# Patient Record
Sex: Female | Born: 1995 | Race: White | Hispanic: No | Smoking: Never smoker
Health system: Southern US, Community
[De-identification: ages and names within clinical notes are randomized; demographics above are authoritative.]

## PROBLEM LIST (undated history)

## (undated) DIAGNOSIS — K769 Liver disease, unspecified: Secondary | ICD-10-CM

## (undated) DIAGNOSIS — I499 Cardiac arrhythmia, unspecified: Secondary | ICD-10-CM

## (undated) DIAGNOSIS — J45909 Unspecified asthma, uncomplicated: Secondary | ICD-10-CM

## (undated) DIAGNOSIS — Q615 Medullary cystic kidney: Secondary | ICD-10-CM

## (undated) DIAGNOSIS — L709 Acne, unspecified: Secondary | ICD-10-CM

## (undated) DIAGNOSIS — T8859XA Other complications of anesthesia, initial encounter: Secondary | ICD-10-CM

## (undated) DIAGNOSIS — Z8489 Family history of other specified conditions: Secondary | ICD-10-CM

## (undated) DIAGNOSIS — G8929 Other chronic pain: Secondary | ICD-10-CM

## (undated) DIAGNOSIS — R519 Headache, unspecified: Secondary | ICD-10-CM

## (undated) DIAGNOSIS — M359 Systemic involvement of connective tissue, unspecified: Secondary | ICD-10-CM

## (undated) DIAGNOSIS — N200 Calculus of kidney: Secondary | ICD-10-CM

## (undated) DIAGNOSIS — E282 Polycystic ovarian syndrome: Secondary | ICD-10-CM

## (undated) HISTORY — DX: Acne, unspecified: L70.9

## (undated) HISTORY — DX: Headache, unspecified: R51.9

## (undated) HISTORY — DX: Other chronic pain: G89.29

## (undated) HISTORY — DX: Cardiac arrhythmia, unspecified: I49.9

## (undated) HISTORY — DX: Gilbert syndrome: E80.4

## (undated) HISTORY — DX: Family history of other specified conditions: Z84.89

## (undated) HISTORY — PX: FINE NEEDLE ASPIRATION BIOPSY: CATH118315

## (undated) HISTORY — DX: Calculus of kidney: N20.0

## (undated) HISTORY — DX: Unspecified asthma, uncomplicated: J45.909

## (undated) HISTORY — DX: Liver disease, unspecified: K76.9

## (undated) HISTORY — DX: Other complications of anesthesia, initial encounter: T88.59XA

## (undated) HISTORY — DX: Systemic involvement of connective tissue, unspecified: M35.9

## (undated) HISTORY — PX: BREAST BIOPSY: SHX20

## (undated) HISTORY — DX: Medullary cystic kidney: Q61.5

## (undated) HISTORY — DX: Polycystic ovarian syndrome: E28.2

---
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
## 2016-05-12 ENCOUNTER — Other Ambulatory Visit
Admission: RE | Admit: 2016-05-12 | Discharge: 2016-05-12 | Disposition: A | Discharge status: Home / Self Care | Payer: Self-pay | Source: Ambulatory Visit | Attending: Internal Medicine | Admitting: Internal Medicine

## 2016-05-12 DIAGNOSIS — R0982 Postnasal drip: Secondary | ICD-10-CM | POA: Insufficient documentation

## 2016-05-12 DIAGNOSIS — R070 Pain in throat: Secondary | ICD-10-CM | POA: Insufficient documentation

## 2016-05-12 DIAGNOSIS — R112 Nausea with vomiting, unspecified: Secondary | ICD-10-CM | POA: Insufficient documentation

## 2016-05-12 DIAGNOSIS — R509 Fever, unspecified: Secondary | ICD-10-CM | POA: Insufficient documentation

## 2016-05-12 DIAGNOSIS — R109 Unspecified abdominal pain: Secondary | ICD-10-CM | POA: Insufficient documentation

## 2016-05-12 DIAGNOSIS — Q618 Other cystic kidney diseases: Secondary | ICD-10-CM | POA: Insufficient documentation

## 2016-05-12 LAB — CBC WITH DIFFERENTIAL/PLATELET
Basophils Absolute: 0 10*3/uL (ref 0–0.1)
Basophils Relative: 1 %
EOS ABS: 0.1 10*3/uL (ref 0–0.7)
HCT: 40.6 % (ref 35.0–47.0)
HEMOGLOBIN: 14.6 g/dL (ref 12.0–16.0)
LYMPHS ABS: 1.1 10*3/uL (ref 1.0–3.6)
MCH: 31.8 pg (ref 26.0–34.0)
MCHC: 35.9 g/dL (ref 32.0–36.0)
MCV: 88.7 fL (ref 80.0–100.0)
Monocytes Absolute: 1 10*3/uL — ABNORMAL HIGH (ref 0.2–0.9)
Neutro Abs: 6.3 10*3/uL (ref 1.4–6.5)
Neutrophils Relative %: 73 %
Platelets: 204 10*3/uL (ref 150–440)
RBC: 4.58 MIL/uL (ref 3.80–5.20)
RDW: 12.8 % (ref 11.5–14.5)
WBC: 8.6 10*3/uL (ref 3.6–11.0)

## 2016-05-12 LAB — COMPREHENSIVE METABOLIC PANEL
ALK PHOS: 46 U/L (ref 38–126)
ALT: 12 U/L — AB (ref 14–54)
ANION GAP: 11 (ref 5–15)
AST: 17 U/L (ref 15–41)
Albumin: 4.8 g/dL (ref 3.5–5.0)
BUN: 11 mg/dL (ref 6–20)
CALCIUM: 9.9 mg/dL (ref 8.9–10.3)
CO2: 25 mmol/L (ref 22–32)
CREATININE: 0.76 mg/dL (ref 0.44–1.00)
Chloride: 102 mmol/L (ref 101–111)
Glucose, Bld: 91 mg/dL (ref 65–99)
Potassium: 3.6 mmol/L (ref 3.5–5.1)
SODIUM: 138 mmol/L (ref 135–145)
TOTAL PROTEIN: 7.3 g/dL (ref 6.5–8.1)
Total Bilirubin: 2.5 mg/dL — ABNORMAL HIGH (ref 0.3–1.2)

## 2020-05-08 ENCOUNTER — Other Ambulatory Visit: Payer: Self-pay | Admitting: Obstetrics and Gynecology

## 2020-05-08 DIAGNOSIS — N6310 Unspecified lump in the right breast, unspecified quadrant: Secondary | ICD-10-CM

## 2020-08-07 ENCOUNTER — Other Ambulatory Visit: Payer: Self-pay

## 2020-08-07 ENCOUNTER — Other Ambulatory Visit: Payer: Self-pay | Admitting: Obstetrics and Gynecology

## 2020-08-07 ENCOUNTER — Ambulatory Visit
Admission: RE | Admit: 2020-08-07 | Discharge: 2020-08-07 | Disposition: A | Discharge status: Home / Self Care | Payer: 59 | Source: Ambulatory Visit | Attending: Obstetrics and Gynecology | Admitting: Obstetrics and Gynecology

## 2020-08-07 DIAGNOSIS — N6310 Unspecified lump in the right breast, unspecified quadrant: Secondary | ICD-10-CM

## 2020-08-23 ENCOUNTER — Ambulatory Visit
Admission: RE | Admit: 2020-08-23 | Discharge: 2020-08-23 | Disposition: A | Discharge status: Home / Self Care | Payer: 59 | Source: Ambulatory Visit | Attending: Obstetrics and Gynecology | Admitting: Obstetrics and Gynecology

## 2020-08-23 ENCOUNTER — Other Ambulatory Visit: Payer: Self-pay | Admitting: Obstetrics and Gynecology

## 2020-08-23 ENCOUNTER — Other Ambulatory Visit: Payer: Self-pay

## 2020-08-23 DIAGNOSIS — N6310 Unspecified lump in the right breast, unspecified quadrant: Secondary | ICD-10-CM

## 2020-08-26 NOTE — L&D Delivery Note (Signed)
Delivery Note She progressed to complete and pushed well.  At 3:45 PM a viable female was delivered via Vaginal, Spontaneous (Presentation: Left Occiput Anterior).  APGAR: 8, 9; weight pending.   Placenta status: Spontaneous, Intact.  Cord: 3 vessels with the following complications: Nuchal x 1, body x 1, delivered through.   Anesthesia: Epidural Episiotomy: None Lacerations: Perineal;2nd degree-extended to left labia Suture Repair: 3.0 vicryl rapide Est. Blood Loss (mL):  200  Mom to postpartum.  Baby to Couplet care / Skin to Skin.  Diana Yang 05/26/2021, 4:07 PM

## 2020-11-06 LAB — OB RESULTS CONSOLE ABO/RH: RH Type: POSITIVE

## 2020-11-06 LAB — OB RESULTS CONSOLE GC/CHLAMYDIA
Chlamydia: NEGATIVE
Gonorrhea: NEGATIVE

## 2020-11-06 LAB — OB RESULTS CONSOLE RPR: RPR: NONREACTIVE

## 2020-11-06 LAB — OB RESULTS CONSOLE ANTIBODY SCREEN: Antibody Screen: NEGATIVE

## 2020-11-06 LAB — OB RESULTS CONSOLE HIV ANTIBODY (ROUTINE TESTING): HIV: NONREACTIVE

## 2020-11-06 LAB — OB RESULTS CONSOLE RUBELLA ANTIBODY, IGM: Rubella: IMMUNE

## 2020-11-06 LAB — OB RESULTS CONSOLE HEPATITIS B SURFACE ANTIGEN: Hepatitis B Surface Ag: NEGATIVE

## 2020-12-04 ENCOUNTER — Encounter: Payer: Self-pay | Admitting: Registered Nurse

## 2020-12-04 ENCOUNTER — Ambulatory Visit: Payer: 59 | Admitting: Registered Nurse

## 2020-12-04 ENCOUNTER — Other Ambulatory Visit: Payer: Self-pay

## 2020-12-04 VITALS — BP 114/62 | HR 92 | Temp 98.2°F | Resp 16 | Ht 66.5 in | Wt 147.4 lb

## 2020-12-04 DIAGNOSIS — Z7689 Persons encountering health services in other specified circumstances: Secondary | ICD-10-CM | POA: Diagnosis not present

## 2020-12-04 DIAGNOSIS — M359 Systemic involvement of connective tissue, unspecified: Secondary | ICD-10-CM | POA: Diagnosis not present

## 2020-12-04 DIAGNOSIS — Z3A15 15 weeks gestation of pregnancy: Secondary | ICD-10-CM | POA: Insufficient documentation

## 2020-12-04 DIAGNOSIS — Q615 Medullary cystic kidney: Secondary | ICD-10-CM | POA: Diagnosis not present

## 2020-12-04 NOTE — Progress Notes (Signed)
New Patient Office Visit  Subjective:  Patient ID: Diana Yang, female    DOB: February 24, 1996  Age: 25 y.o. MRN: [id number]  CC:  Chief Complaint  Patient presents with  . Establish Care    No concerns looking to establish PCP, pt is currently apx 15 weeks 1st pregnancy. 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 FNA w biopsy x 2 in 2019 and 2021. Benign findings. No acute concerns. Eventually will need to reestablish with nephrology locally.  Hx of autoimmune process - unfortunately diagnostics not finished in [place] before moving to [place], but has been suggested she may have SLE. Requesting referral to rheumatology locally to continue this process.  Currently pregnant, about 15w. First pregnancy. Est with Dr. Brien Mates with [facility]. Taking prenatal vitamins and folate. No concerns. Lives with partner who is supportive, safe and healthy home.  Otherwise fine  Past Medical History:  Diagnosis Date  . Autoimmune disease ([place])    diagnostics not finished prior to pt moving to [place]  . Medullary sponge kidney     Past Surgical History:  Procedure Laterality Date  . FINE NEEDLE ASPIRATION BIOPSY     kidneys, findings wnl  . TONSILLECTOMY AND ADENOIDECTOMY  2015    Family History  Problem Relation Age of Onset  . Heart attack Mother   . Kidney disease Mother   . Asthma Mother   . Alcohol abuse Father   . Drug abuse Father   . Early death Father        63  . Stroke Father        Thyroid storm  . Graves' disease Father   . Thyroid disease Father   . Arthritis Brother        OA - from [place]  . Depression Brother   . Post-traumatic stress disorder Brother        [employer]  . Breast cancer Maternal Grandmother   . Lung cancer Maternal Grandmother        smoker - but "not from smoking"  . Multiple myeloma  Maternal Grandmother        "rare blood cancer"  . High blood pressure Maternal Grandmother   . Heart attack Maternal Grandmother   . Hearing loss Maternal Grandmother   . Liver cancer Maternal Grandfather   . Diabetes Maternal Grandfather   . High Cholesterol Maternal Grandfather   . Breast cancer Paternal Grandmother   . Heart attack Paternal Grandmother   . Heart disease Paternal Grandmother   . Alcohol abuse Paternal Grandfather     Social History   Socioeconomic History  . Marital status: Significant Other    Spouse name: Not on file  . Number of children: 0  . Years of education: Not on file  . Highest education level: Not on file  Occupational History  . Occupation: med lab  Tobacco Use  . Smoking status: Never Smoker  . Smokeless tobacco: Never Used  Substance and Sexual Activity  . Alcohol use: Not on file    Comment: prior to prgegnancy 4 drinks 1 week  . Drug use: Never  . Sexual activity: Yes  Other Topics Concern  . Not on file  Social History Narrative   From [place]. Was down here for college, recently returned after being back in [place] for a  bit.   Social Determinants of Health   Financial Resource Strain: Not on file  Food Insecurity: Not on file  Transportation Needs: Not on file  Physical Activity: Not on file  Stress: Not on file  Social Connections: Not on file  Intimate Partner Violence: Not on file    ROS Review of Systems  Constitutional: Negative.   HENT: Negative.   Eyes: Negative.   Respiratory: Negative.   Cardiovascular: Negative.   Gastrointestinal: Negative.   Genitourinary: Negative.   Musculoskeletal: Negative.   Skin: Negative.   Neurological: Negative.   Psychiatric/Behavioral: Negative.   All other systems reviewed and are negative.   Objective:   Today's Vitals: BP 114/62   Pulse 92   Temp 98.2 F (36.8 C) (Temporal)   Resp 16   Ht 5' 6.5" (1.689 m)   Wt 147 lb 6.4 oz (66.9 kg)   SpO2 99%   BMI 23.43 kg/m    Physical Exam Vitals and nursing note reviewed.  Constitutional:      General: She is not in acute distress.    Appearance: Normal appearance. She is not ill-appearing, toxic-appearing or diaphoretic.  Cardiovascular:     Rate and Rhythm: Normal rate and regular rhythm.     Pulses: Normal pulses.     Heart sounds: Normal heart sounds. No murmur heard. No friction rub. No gallop.   Pulmonary:     Effort: Pulmonary effort is normal. No respiratory distress.     Breath sounds: Normal breath sounds. No stridor. No wheezing, rhonchi or rales.  Chest:     Chest wall: No tenderness.  Skin:    General: Skin is warm and dry.     Capillary Refill: Capillary refill takes less than 2 seconds.  Neurological:     General: No focal deficit present.     Mental Status: She is alert and oriented to person, place, and time. Mental status is at baseline.  Psychiatric:        Mood and Affect: Mood normal.        Behavior: Behavior normal.        Thought Content: Thought content normal.        Judgment: Judgment normal.     Assessment & Plan:   Problem List Items Addressed This Visit      Genitourinary   Medullary sponge kidney of both kidneys     Other   Autoimmune disease ([place])   [redacted] weeks gestation of pregnancy    Other Visit Diagnoses    Encounter to establish care    -  Primary      Outpatient Encounter Medications as of 12/04/2020  Medication Sig  . Prenatal Vit-Fe Fumarate-FA (PRENATAL MULTIVITAMIN) TABS tablet Take 1 tablet by mouth daily at 12 noon.   No facility-administered encounter medications on file as of 12/04/2020.    Follow-up: No follow-ups on file.   PLAN  Pt has had labs with OBGYN, request that she have these faxed to our office  Refer to Rheumatology for further work up of possible SLE.  Can place referral to nephrology at pt preference  Return post partum for CPE and labs  Patient encouraged to call clinic with any questions, comments, or  concerns.  Maximiano Coss, NP

## 2020-12-07 ENCOUNTER — Encounter: Payer: Self-pay | Admitting: Registered Nurse

## 2020-12-11 ENCOUNTER — Telehealth: Payer: Self-pay

## 2020-12-11 NOTE — Telephone Encounter (Signed)
Mar/LM for patient (to schedule MD Consult referral received).

## 2020-12-12 ENCOUNTER — Other Ambulatory Visit: Payer: Self-pay | Admitting: Obstetrics and Gynecology

## 2020-12-12 ENCOUNTER — Telehealth: Payer: Self-pay

## 2020-12-12 DIAGNOSIS — Z3482 Encounter for supervision of other normal pregnancy, second trimester: Secondary | ICD-10-CM

## 2020-12-12 NOTE — Telephone Encounter (Signed)
Mar/sw office requesting prenatal records for [place] and Gen Couns scheduled for 01/03/2021.

## 2020-12-22 ENCOUNTER — Encounter: Payer: Self-pay | Admitting: Registered Nurse

## 2020-12-22 DIAGNOSIS — R768 Other specified abnormal immunological findings in serum: Secondary | ICD-10-CM | POA: Insufficient documentation

## 2020-12-22 DIAGNOSIS — M329 Systemic lupus erythematosus, unspecified: Secondary | ICD-10-CM | POA: Insufficient documentation

## 2020-12-28 ENCOUNTER — Encounter: Payer: Self-pay | Admitting: *Deleted

## 2021-01-03 ENCOUNTER — Ambulatory Visit: Payer: 59 | Attending: Obstetrics and Gynecology

## 2021-01-03 ENCOUNTER — Other Ambulatory Visit: Payer: Self-pay

## 2021-01-03 ENCOUNTER — Ambulatory Visit: Payer: 59 | Admitting: *Deleted

## 2021-01-03 VITALS — BP 102/62 | HR 88

## 2021-01-03 DIAGNOSIS — Z3A19 19 weeks gestation of pregnancy: Secondary | ICD-10-CM | POA: Diagnosis not present

## 2021-01-03 DIAGNOSIS — Z148 Genetic carrier of other disease: Secondary | ICD-10-CM | POA: Diagnosis present

## 2021-01-03 DIAGNOSIS — Z363 Encounter for antenatal screening for malformations: Secondary | ICD-10-CM | POA: Diagnosis not present

## 2021-01-03 DIAGNOSIS — Z3482 Encounter for supervision of other normal pregnancy, second trimester: Secondary | ICD-10-CM | POA: Insufficient documentation

## 2021-01-25 ENCOUNTER — Telehealth: Payer: 59 | Admitting: Family Medicine

## 2021-01-25 ENCOUNTER — Telehealth: Payer: Self-pay | Admitting: *Deleted

## 2021-01-25 ENCOUNTER — Encounter: Payer: Self-pay | Admitting: Family Medicine

## 2021-01-25 ENCOUNTER — Telehealth: Payer: Self-pay

## 2021-01-25 DIAGNOSIS — R42 Dizziness and giddiness: Secondary | ICD-10-CM

## 2021-01-25 DIAGNOSIS — J029 Acute pharyngitis, unspecified: Secondary | ICD-10-CM | POA: Diagnosis not present

## 2021-01-25 DIAGNOSIS — Z20822 Contact with and (suspected) exposure to covid-19: Secondary | ICD-10-CM

## 2021-01-25 DIAGNOSIS — R0981 Nasal congestion: Secondary | ICD-10-CM

## 2021-01-25 DIAGNOSIS — R059 Cough, unspecified: Secondary | ICD-10-CM

## 2021-01-25 DIAGNOSIS — Z3490 Encounter for supervision of normal pregnancy, unspecified, unspecified trimester: Secondary | ICD-10-CM

## 2021-01-25 DIAGNOSIS — R0789 Other chest pain: Secondary | ICD-10-CM

## 2021-01-25 NOTE — Patient Instructions (Signed)
Seek in person medical evaluation promptly right away today as we discussed.  Go to the emergency room or to the women's center if that is where you are told to go by your pregnancy doctor.  Can call your pregnancy doctor to see what options there are for in person evaluation today.  I hope you are feeling better soon!  It was nice to meet you today. I help [facility] out with telemedicine visits on Tuesdays and Thursdays and am available for visits on those days. If you have any concerns or questions following this visit please schedule a follow up visit with your Primary Care doctor or seek care at a local urgent care clinic to avoid delays in care.

## 2021-01-25 NOTE — Telephone Encounter (Signed)
Nurse Assessment Nurse: Zenia Resides, RN, Diane Date/Time Eilene Ghazi Time): 01/25/2021 7:00:45 AM Confirm and document reason for call. If symptomatic, describe symptoms. ---Caller states exposed to COVID. Symptoms x 2 days. Pt. has chills, off and on fever, stuffy nose and sore throat. Pt. is [redacted] weeks pregnant. No chest pain or pressure. No sob. Feeling baby move. No bleeding or fluids leaking or abd. pain. Does the patient have any new or worsening symptoms? ---Yes Will a triage be completed? ---Yes Related visit to physician within the last 2 weeks? ---No Does the PT have any chronic conditions? (i.e. diabetes, asthma, this includes High risk factors for pregnancy, etc.) ---Yes List chronic conditions. ---asthma Is the patient pregnant or possibly pregnant? (Ask all females between the ages of 36-55) ---Yes How many weeks gestation? ---31 What is the estimated delivery date? ---2021-05-29 Total number of pregnancies including current? ---1 Number of live births? ---0 PLEASE NOTE: All timestamps contained within this report are represented as [place] Standard Time. CONFIDENTIALTY NOTICE: This fax transmission is intended only for the addressee. It contains information that is legally privileged, confidential or otherwise protected from use or disclosure. If you are not the intended recipient, you are strictly prohibited from reviewing, disclosing, copying using or disseminating any of this information or taking any action in reliance on or regarding this information. If you have received this fax in error, please notify us immediately by telephone so that we can arrange for its return to [place]. Phone: [phone], Toll-Free: [phone], Fax: [phone] Page: 2 of 2 Call Id: [id number] Nurse Assessment Have you felt decreased fetal movement? ---No Is this a behavioral health or substance abuse call? ---No Guidelines Guideline Title Affirmed Question Affirmed Notes Nurse Date/Time  ([place] Time) COVID-19 - Diagnosed or Suspected [1] COVID-19 infection suspected by caller or triager AND [2] mild symptoms (cough, fever, or others) AND [3] negative COVID-19 rapid test Zenia Resides, RN, Diane 01/25/2021 7:03:27 AM Disp. Time Eilene Ghazi Time) Disposition Final User 01/25/2021 7:07:39 AM Call PCP when Office is Open Yes Zenia Resides, RN, Diane Caller Disagree/Comply Comply Caller Understands Yes PreDisposition Go to Urgent Care/Walk-In Clinic Care Advice Given Per Guideline CALL PCP WHEN OFFICE IS OPEN: * You need to discuss this with your doctor (or NP/PA) within the next few days. REASSURANCE AND EDUCATION - SUSPECTED COVID-19 AND NEGATIVE RAPID COVID-19 TEST: * Positive rapid test results are accurate and can be trusted. Negative rapid test results are usually accurate, but can sometimes be wrong. COUGHING SPELLS: * Drink warm fluids. Inhale warm mist. This can help relax the airway and also loosen up phlegm. * ACETAMINOPHEN REGULAR STRENGTH TYLENOL: Take 650 mg (two 325 mg pills) by mouth every 4 to 6 hours as needed. Each Regular Strength Tylenol pill has 325 mg of acetaminophen. The most you should take each day is 3,250 mg (10 pills a day). PAIN AND FEVER MEDICINES: CALL BACK IF: * Fever lasts over 3 days * Chest pain or difficulty breathing occurs CARE ADVICE given per COVID-19 - DIAGNOSED OR SUSPECTED (Adult) guideline. Referrals REFERRED TO PCP OFFIC

## 2021-01-25 NOTE — Telephone Encounter (Signed)
Per Dr Maudie Mercury I called to check on the patient to see if she was evaluated after the virtual visit.  I left a detailed message with this information on the patients cell number as she did not answer.  Message sent to Dr Maudie Mercury.

## 2021-01-25 NOTE — Progress Notes (Signed)
Virtual Visit via Video Note  I connected with Diana Yang  on 01/25/21 at 12:20 PM EDT by a video enabled telemedicine application and verified that I am speaking with the correct person using two identifiers.  Location patient: home, [place] Location provider:work or home office Persons participating in the virtual visit: patient, provider  I discussed the limitations of evaluation and management by telemedicine and the availability of in person appointments. The patient expressed understanding and agreed to proceed.   HPI:  Acute telemedicine visit for possible COVID19: -boyfriend has covid currently - lives with her, rest of family she was around also had covid -she has tested negative on home tests, but feels sick -currently pregnant at [redacted] weeks -Onset: 6 days ago -Symptoms include: sore throat, nasal congestion, subjective low grade fever, mild cough, sneezing, now feeling dizzy whenever she gets up and some mild chest pressure at times -Denies: CP, SOB, NVD, inability to eat/drink/get out of bed -Has tried: hot tea, cough drops -Pertinent past medical history:  Currently undergoing evaluation for possible autoimmune disease, reports possible lupus but no confirmed dx currently, not on any medications -Pertinent medication allergies: Allergies  Allergen Reactions  . Banana Anaphylaxis  . Penicillins Hives, Itching, Nausea And Vomiting, Rash and Shortness Of Breath   -COVID-19 vaccine status:not vaccinated  ROS: See pertinent positives and negatives per HPI.  Past Medical History:  Diagnosis Date  . Autoimmune disease ([place])    diagnostics not finished prior to pt moving to [place]  . Complication of anesthesia    PTS MOTHER AND GRANDMOTHER SLOW TO WAKE UP  . Medullary sponge kidney     Past Surgical History:  Procedure Laterality Date  . FINE NEEDLE ASPIRATION BIOPSY     kidneys, findings wnl  . TONSILLECTOMY AND ADENOIDECTOMY  2015     Current Outpatient Medications:  .   Prenatal Vit-Fe Fumarate-FA (PRENATAL MULTIVITAMIN) TABS tablet, Take 1 tablet by mouth daily at 12 noon., Disp: , Rfl:   EXAM:  VITALS per patient if applicable:  GENERAL: alert, oriented, appears well and in no acute distress  HEENT: atraumatic, conjunttiva clear, no obvious abnormalities on inspection of external nose and ears  NECK: normal movements of the head and neck  LUNGS: on inspection no signs of respiratory distress, breathing rate appears normal, no obvious gross SOB, gasping or wheezing  CV: no obvious cyanosis  MS: moves all visible extremities without noticeable abnormality  PSYCH/NEURO: pleasant and cooperative, no obvious depression or anxiety, speech and thought processing grossly intact  ASSESSMENT AND PLAN:  Discussed the following assessment and plan:  Dizziness  Pregnancy, unspecified gestational age  Cough  Nasal congestion  Sore throat  Chest discomfort  Close exposure to COVID-19 virus  -we discussed possible serious and likely etiologies, options for evaluation and workup, limitations of telemedicine visit vs in person visit, treatment, treatment risks and precautions.  Given current symptoms, pregnancy, possible COVID-19, possibly high risk for complications due to underlying health conditions and unvaccinated, advised prompt in person evaluation.  Given pregnant, she is opted to contact her OB/GYN office to see where they would recommend she go for evaluation, women's center versus the emergency room.  Advised to call right away and to go seek care promptly right away today.  She agrees to do so. I discussed the assessment and treatment plan with the patient. The patient was provided an opportunity to ask questions and all were answered. The patient agreed with the plan and demonstrated an understanding of the  instructions.     Lucretia Kern, DO

## 2021-03-02 ENCOUNTER — Encounter: Payer: Self-pay | Admitting: Internal Medicine

## 2021-03-02 ENCOUNTER — Ambulatory Visit: Payer: 59 | Admitting: Internal Medicine

## 2021-03-02 ENCOUNTER — Other Ambulatory Visit: Payer: Self-pay

## 2021-03-02 VITALS — BP 109/73 | HR 99 | Ht 67.0 in | Wt 167.0 lb

## 2021-03-02 DIAGNOSIS — Z3492 Encounter for supervision of normal pregnancy, unspecified, second trimester: Secondary | ICD-10-CM | POA: Insufficient documentation

## 2021-03-02 DIAGNOSIS — R768 Other specified abnormal immunological findings in serum: Secondary | ICD-10-CM

## 2021-03-02 DIAGNOSIS — M329 Systemic lupus erythematosus, unspecified: Secondary | ICD-10-CM | POA: Diagnosis not present

## 2021-03-02 DIAGNOSIS — D249 Benign neoplasm of unspecified breast: Secondary | ICD-10-CM | POA: Insufficient documentation

## 2021-03-02 NOTE — Progress Notes (Signed)
Office Visit Note  Patient: Diana Yang             Date of Birth: 1995/11/30           MRN: [id number]             PCP: Maximiano Coss, NP Referring: Maximiano Coss, NP Visit Date: 03/02/2021 Occupation: [facility]  Subjective:  New Patient (Initial Visit) (Patient was previously on PLQ  x 3 months, then discontinued x 1 year- managed by previous rheumatologist. Patient complains of muscle weakness, fatigue, generalized joint and muscle pain, and web-like redness on the chest at times. Patient is 6 months pregnant. )   History of Present Illness: Diana Yang is a 25 y.o. 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 She was previously evaluated she believes around age 40 for hospitalization with some kinds of symptoms without specific diagnosis at the time and not on any chronic treatments she feels symptoms became more noticeable after going to college particularly with skin rashes and discoloration of the lower extremities as well as some of the fatigue and generalized aches.  She describes her skin changes as a weblike appearance that will come and go on the chest.  She had more chronic discoloration of her legs but notes this has pretty much cleared up during her pregnancy.  She also describes episodes of chest tightness and palpitations that last for a few minutes at a time.  She seen cardiology for work-up of this with overall unremarkable echocardiogram and EKG.  She has had some question whether this is related to her chronic anxiety though she does not describe very discrete panic attacks.  Workup in 2020 in the setting of seizures, arthralgias, alopecia, chest tightness, and lower extremity discoloration revealed borderline positive double-stranded DNA antibodies and she was started on HCQ 272m daily for possible early autoimmune disease process.  She also  has history of fibroadenoma of the right breast. She also experiences fatigue, headaches, concentration difficulty treated with topiramate for migraines.   Labs reviewed 04/2019 ANA 1:80 speckled  06/2018 ANA neg dsDNA 10 Scl-70, centromere, Jo-1, Smith, SSA, SSB neg ACA, B2GP1, DRVVT neg   Imaging reviewed (report only) 05/2019 CXR negative 05/2019 TTE LVEF 45-50%, left ventricular function is slightly reduced  Activities of Daily Living:  Patient reports morning stiffness for 0 minutes.   Patient Denies nocturnal pain.  Difficulty dressing/grooming: Denies Difficulty climbing stairs: Denies Difficulty getting out of chair: Denies Difficulty using hands for taps, buttons, cutlery, and/or writing: Reports  Review of Systems  Constitutional:  Positive for fatigue.  HENT:  Negative for mouth sores, mouth dryness and nose dryness.   Eyes:  Negative for pain, itching, visual disturbance and dryness.  Respiratory:  Negative for cough, hemoptysis, shortness of breath and difficulty breathing.   Cardiovascular:  Positive for chest pain and palpitations. Negative for swelling in legs/feet.  Gastrointestinal:  Negative for abdominal pain, blood in stool, constipation and diarrhea.  Endocrine: Negative for increased urination.  Genitourinary:  Negative for painful urination.  Musculoskeletal:  Positive for joint swelling, myalgias, muscle weakness, muscle tenderness and myalgias. Negative for joint pain, joint pain and morning stiffness.  Skin:  Positive for redness. Negative for color change and rash.  Allergic/Immunologic: Negative for susceptible to infections.  Neurological:  Positive for headaches. Negative for dizziness, numbness, memory loss and weakness.  Hematological:  Negative for swollen glands.  Psychiatric/Behavioral:  Negative for confusion and sleep disturbance.    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    Past Medical History:  Diagnosis Date   Autoimmune disease ([place])    diagnostics not finished prior to pt moving to [place]   Complication of anesthesia    PTS MOTHER AND GRANDMOTHER SLOW TO WAKE UP   Gilbert's syndrome    Per patient   Medullary sponge kidney     Family History  Problem Relation Age of Onset   Heart attack Mother        Sudden cardiac arrest   Kidney disease Mother    Asthma Mother    Alcohol abuse Father    Drug abuse Father    Early death Father        24   Stroke Father        Thyroid storm   Graves' disease Father    Thyroid disease Father    Arthritis Brother        OA - from marines   Depression Brother    Post-traumatic stress disorder Brother        Marines   Breast cancer Maternal Grandmother    Lung cancer Maternal Grandmother        smoker - but "not from smoking"   Multiple myeloma Maternal Grandmother        "rare blood cancer"   High blood pressure Maternal Grandmother    Heart attack Maternal Grandmother    Hearing loss Maternal Grandmother    Osteoporosis Maternal Grandmother    Liver cancer Maternal Grandfather    Diabetes Maternal Grandfather    High Cholesterol Maternal Grandfather    Alzheimer's disease Maternal Grandfather    Breast cancer Paternal Grandmother    Heart attack Paternal Grandmother    Heart disease Paternal Grandmother    Alcohol abuse Paternal Grandfather    Past Surgical History:  Procedure Laterality Date   BREAST BIOPSY Right    2019, 2021   FINE NEEDLE ASPIRATION BIOPSY     kidneys, findings wnl   TONSILLECTOMY AND ADENOIDECTOMY  2015   Social History   Social History Narrative   From [place]. Was down here for college,  recently returned after being back in [place] for a bit.    There is no immunization history on file for this patient.   Objective: Vital Signs: BP 109/73 (BP Location: Right Arm, Patient Position: Sitting, Cuff Size: Normal)   Pulse 99   Ht 5' 7"  (1.702 m)   Wt 167 lb (75.8 kg)   LMP 05/04/2020   BMI 26.16 kg/m    Physical Exam HENT:     Mouth/Throat:     Mouth: Mucous membranes are moist.     Pharynx: Oropharynx is clear.  Eyes:     Conjunctiva/sclera: Conjunctivae normal.  Cardiovascular:     Rate and Rhythm: Normal rate and regular rhythm.  Pulmonary:     Effort: Pulmonary effort is normal.     Breath sounds: Normal breath sounds.  Skin:    General: Skin is warm and dry.     Comments: Dry skin and flaking on scalp without alopecia  Facial erythema and papules scattered throughout Trace edema in bilateral ankles without overlying skin changes  Neurological:     Mental Status: She is alert.     Musculoskeletal Exam:  Shoulders full ROM no tenderness or swelling Elbows full ROM no tenderness or swelling Wrists full ROM no tenderness or swelling Fingers full ROM no tenderness or swelling Knees full ROM no tenderness or swelling Ankles full ROM no tenderness or swelling Bilateral flat feet  Investigation: No additional findings.  Imaging: No results found.  Recent Labs: Lab Results  Component Value Date   WBC 8.6 05/12/2016   HGB 14.6 05/12/2016   PLT 204 05/12/2016   NA 138 05/12/2016   K 3.6 05/12/2016   CL 102 05/12/2016   CO2 25 05/12/2016   GLUCOSE 91 05/12/2016   BUN 11 05/12/2016   CREATININE 0.76 05/12/2016   BILITOT 2.5 (H) 05/12/2016   ALKPHOS 46 05/12/2016   AST 17 05/12/2016   ALT 12 (L) 05/12/2016   PROT 7.3 05/12/2016   ALBUMIN 4.8 05/12/2016   CALCIUM 9.9 05/12/2016   GFRAA >60 05/12/2016    Speciality Comments: No specialty comments available.  Procedures:  No procedures performed Allergies: Banana and Penicillins   Assessment /  Plan:     Visit Diagnoses: Ds DNA antibody positive Systemic lupus erythematosus, unspecified SLE type, unspecified organ involvement status ([place]) - Plan: Anti-DNA antibody, double-stranded, Protein / creatinine ratio, urine, C3 and C4, Sedimentation rate  History of positive double-stranded DNA antibodies and suspected lupus although clinical symptoms are not present on exam today.  I discussed there can also be decreased autoimmune disease activity during pregnancy from permissive state.  We will check for markers of disease activity double-stranded DNA complements and sedimentation rate check protein creatinine ratio.  Of note this may be somewhat elevated due to pregnancy.  If there is evidence for activity may discuss treatment option probably resuming hydroxychloroquine first otherwise would just monitor for now follow-up after her delivery next year.  Pregnant and not yet delivered in second trimester  Currently pregnant without any known complications to date.  Currently not on any prophylaxis needed based on OB/GYN evaluation.  Negative SSA antibodies and negative antiphospholipid antibodies from past work-up.  Orders: Orders Placed This Encounter  Procedures   Anti-DNA antibody, double-stranded   Protein / creatinine ratio, urine   C3 and C4   Sedimentation rate   No orders of the defined types were placed in this encounter.   Follow-Up Instructions: No follow-ups on file.   Collier Salina, MD  Note - This record has been created using [employer].  Chart creation errors have been sought, but may not always  have been located. Such creation errors do not reflect on  the standard of medical care.

## 2021-03-02 NOTE — Patient Instructions (Addendum)
Anti-DNA Antibody Test Why am I having this test? The anti-DNA antibody test helps with the diagnosis and follow-up of systemic lupus erythematosus (SLE). It is also used to monitor treatment of thiscondition as the antibody decreases with successful therapy. What is being tested? This test measures the amount of anti-DNA antibody in the blood. This antibody is found in 65-80% of patients with active SLE. This antibody is not as commonin patients who have other diseases. What kind of sample is taken?  A blood sample is required for this test. It is usually collected by insertinga needle into a blood vessel. How are the results reported? Your test results will be reported as a value. Your test results may also be reported as positive, intermediate, or negative. Your health care provider will compare your results to normal ranges that were established after testing a large group of people (reference values). Reference values may vary among labs and hospitals. For this test, common reference values are: Positive: 10 or more international units/mL. Intermediate: 5-9 international units/mL. Negative: Less than 5 international units/mL. What do the results mean? Positive results, which are associated with results that are higher than the reference values, may indicate: Autoimmune disorders such as SLE. Infectious mononucleosis. Chronic liver conditions. Intermediate results mean that the anti-DNA antibody levels are higher thannormal, but not high enough to be considered positive. Negative results mean that you do not have the anti-DNA antibody that isassociated with these conditions. Talk with your health care provider about what your results mean. Questions to ask your health care provider Ask your health care provider, or the department that is doing the test: When will my results be ready? How will I get my results? What are my treatment options? What other tests do I need? What are my next  steps? Summary The anti-DNA antibody test helps with the diagnosis and follow-up of systemic lupus erythematosus (SLE). It is also used to monitor treatment of this condition as the antibody decreases with successful therapy. This test measures the amount of anti-DNA antibody in the blood. Elevated levels of anti-DNA antibody can be seen in patients with SLE and certain other conditions.  Complement Assay Test Why am I having this test? Complement refers to a group of proteins that are part of the body's disease-fighting system (immune system). A complement assay test provides information about some or all of these proteins. You may have this test: To diagnose a lack, or deficiency, of certain complement proteins. Deficiencies can be passed from parent to child (inherited). To monitor an infection or autoimmune disease. If you have unexplained inflammation or swelling (edema). If you have bacterial infections again and again. What is being tested? This test can be used to measure: Total complement. This is the total number of protein complements in your blood. The number of each kind of complement in your blood. The nine main kinds of complement are labeled C1 through C9. Some of these complements, such as C3 and C4, are especially important and have many functions in the body. Depending on why you are having the test, your health care provider may test your total complement or only some individual complements, such as C3 and C4. The total complement assay test may be done before individual complements aretested. What kind of sample is taken?  A blood sample is required for this test. It is usually collected by insertinga needle into a blood vessel. Tell a health care provider about: Any allergies you have. All medicines you are taking,  including vitamins, herbs, eye drops, creams, and over-the-counter medicines. Any blood disorders you have. Any surgeries you have had. Any medical  conditions you have. Whether you are pregnant or may be pregnant. How are the results reported? Your results will be reported as a value that tells you how much complement is in your blood. This will be given as units per milliliter of blood (units/mL) or as milligrams per deciliter of blood (mg/dL). Your results may be reportedas total complement, or as individual complements, or both. Your health care provider will compare your results to normal ranges that were established after testing a large group of people (reference ranges). Reference ranges may vary among labs and hospitals. For this test, reference ranges for some of the most commonly measured complement assays may be: Total complement: 30-75 units/mL. C2: 1-4 mg/dL. C3: 75-175 mg/dL. C4: 22-45 units/mL. What do the results mean? Results within reference ranges are considered normal, which means you have anormal amount of complement in your blood. Results that are higher than the reference ranges may be caused by: Inflammatory disease. Heart attack. Cancer. Complement deficiencies, or results lower than the reference ranges, may be caused by: Certain inherited conditions. Autoimmune disease. Certain liver diseases. Malnutrition. Certain types of anemia that result in breakdown of red blood cells (hemolytic anemia). Talk with your health care provider about what your results mean. Questions to ask your health care provider Ask your health care provider, or the department that is doing the test: When will my results be ready? How will I get my results? What are my treatment options? What other tests do I need? What are my next steps? Summary Complement refers to a group of proteins that are part of the body's disease-fighting system (immune system). A complement assay test can provide information about some or all of these proteins. You may have a complement assay test to help diagnose a complement deficiency, and to monitor  some infections or autoimmune disease. Talk with your health care provider about what your results mean.  Erythrocyte Sedimentation Rate Test Why am I having this test? The erythrocyte sedimentation rate (ESR) test is used to help find illnesses related to: Sudden (acute) or long-term (chronic) infections. Inflammation. The body's disease-fighting system attacking healthy cells (autoimmune diseases). Cancer. Tissue death. If you have symptoms that may be related to any of these illnesses, your health care provider may do an ESR test before doing more specific tests. If you have an inflammatory immune disease, such as rheumatoid arthritis, you may have thistest to help monitor your therapy. What is being tested? This test measures how long it takes for your red blood cells (erythrocytes) to settle in a solution over a certain amount of time (sedimentation rate). When you have an infection or inflammation, your red blood cells clump together and settle faster. The sedimentation rate provides information abouthow much inflammation is present in the body. What kind of sample is taken?  A blood sample is required for this test. It is usually collected by insertinga needle into a blood vessel. How do I prepare for this test? Follow any instructions from your health care provider about changing orstopping your regular medicines. Tell a health care provider about: Any allergies you have. All medicines you are taking, including vitamins, herbs, eye drops, creams, and over-the-counter medicines. Any blood disorders you have. Any surgeries you have had. Any medical conditions you have, such as thyroid or kidney disease. Whether you are pregnant or may be pregnant. How are  the results reported? Your results will be reported as a value that measures sedimentation rate in millimeters per hour (mm/hr). Your health care provider will compare your results to normal ranges that were established after  testing a large group of people (reference values). Reference values may vary among labs and hospitals. For this test, common reference values, which vary by age and gender, are: Newborn: 0-2 mm/hr. Child, up to puberty: 0-10 mm/hr. Female: Under 50 years: 0-20 mm/hr. 50-85 years: 0-30 mm/hr. Over 85 years: 0-42 mm/hr. Female: Under 50 years: 0-15 mm/hr. 50-85 years: 0-20 mm/hr. Over 85 years: 0-30 mm/hr. Certain conditions or medicines may cause ESR levels to be falsely lower or higher, such as: Pregnancy. Obesity. Steroids, birth control pills, and blood thinners. Thyroid or kidney disease. What do the results mean? Results that are within reference values are considered normal, meaning that the level of inflammation in your body is healthy. High ESR levels mean that there is inflammation in your body. You will have more tests to help make adiagnosis. Inflammation may result from many different conditions or injuries. Talk with your health care provider about what your results mean. Questions to ask your health care provider Ask your health care provider, or the department that is doing the test: When will my results be ready? How will I get my results? What are my treatment options? What other tests do I need? What are my next steps? Summary The erythrocyte sedimentation rate (ESR) test is used to help find illnesses associated with sudden (acute) or long-term (chronic) infections, inflammation, autoimmune diseases, cancer, or tissue death. If you have symptoms that may be related to any of these illnesses, your health care provider may do an ESR test before doing more specific tests. If you have an inflammatory immune disease, such as rheumatoid arthritis, you may have this test to help monitor your therapy. This test measures how long it takes for your red blood cells (erythrocytes) to settle in a solution over a certain amount of time (sedimentation rate). This provides information  about how much inflammation is present in the body.   Urine Protein Test Why am I having this test? A urine protein test may be used to check the health of your kidneys and to look for kidney damage or kidney disease. The test may be done as part of a normal screening or if you have a condition that affects your kidneys, such as: Diabetes. High blood pressure (hypertension). Urinary tract infection. Problems resulting from pregnancy, including preeclampsia. Your health care provider may also do this test if you have symptoms of kidney disease. These can include: Fluid retention, also called edema. Tiredness, or fatigue. Nausea. What is being tested? This test measures the amount of protein in your urine. Your blood contains proteins. When your blood is filtered by your kidneys, most of the proteins should be returned to your blood and there should be no protein or very little protein in your urine. If you have a large amount of protein in your urine (proteinuria), your kidneys may not be working as well as they should. What kind of sample is taken? This test requires a urine sample. Two methods may be used for the test: A dipstick test is often the first step in a urine protein test. After you urinate into a germ-free (sterile) cup, your health care provider will dip a test strip into your sample. The health care provider can tell right away if there is protein in your urine.  Your health care provider may do a 24-hour urine test if the dipstick test shows that there is a large amount of protein in your urine. This test measures the amount of protein that is released in your urine over a 24-hour period. You may also have other tests to find out the types of proteins you have inyour urine. How do I collect samples at home?  If your health care provider wants to do a 24-hour urine test, you may be asked to collect urine samples at home over a 24-hour period. Follow instructionsfrom a health care  provider about how to collect the samples. When collecting a urine sample at home, make sure you: Use supplies and instructions that you received from the lab. Collect urine only in the sterile cup that you received from the lab. Do not let any toilet paper or stool (feces) get into the cup. Refrigerate the sample or keep it on ice until you can return it to the lab. Return the sample(s) to the lab as instructed. Tell a health care provider about: All medicines you are taking, including vitamins, herbs, eye drops, creams, and over-the-counter medicines. Any medical conditions you have. Some conditions can cause you to have protein in your urine. Whether you have had a radiology scan with contrast dye. Whether you have exercised heavily in the last few days. Whether you are pregnant or may be pregnant. How are the results reported? The result of the urine dipstick test for proteins will be reported as either positive or negative. A negative result means no protein or a small amount of protein was found in your urine. A positive result means a large amount of protein was found in your urine. The result of the 24-hour urine test will be reported as a value that indicates the amount of protein in your urine. Your health care provider will compare your results to normal ranges that were established after testing a large group of people (reference ranges). Reference ranges may vary among labs and hospitals. For someone at rest, 50-80 mg per 24 hours is considered normal. For someone exercising, 50-250 mgper 24 hours is considered normal. What do the results mean? Many conditions can cause a level of protein in your urine that is higher than normal, such as: Dehydration. Doing exercise that takes a lot of effort (is vigorous). Urinary tract infection. High blood pressure, heart failure, or diabetes. Kidney disease. Bladder cancer. Preeclampsia, in pregnant women. Talk with your health care  provider about what your results mean. Questions to ask your health care provider Ask your health care provider, or the department that is doing the test: When will my results be ready? How will I get my results? What are my treatment options? What other tests do I need? What are my next steps? Summary The urine protein test is often used to screen for kidney disease. This test measures the amount of protein in your urine. If you have a large amount of protein in your urine (proteinuria), your kidneys may not be working as well as they should. Your health care provider may do a 24-hour urine test if the dipstick test shows that there is a large amount of protein in your urine. Talk with your health care provider about what your results mean.

## 2021-03-05 LAB — PROTEIN / CREATININE RATIO, URINE
Creatinine, Urine: 82 mg/dL (ref 20–275)
Protein/Creat Ratio: 146 mg/g creat (ref 21–161)
Protein/Creatinine Ratio: 0.146 mg/mg creat (ref 0.021–0.161)
Total Protein, Urine: 12 mg/dL (ref 5–24)

## 2021-03-05 LAB — C3 AND C4
C3 Complement: 139 mg/dL (ref 83–193)
C4 Complement: 24 mg/dL (ref 15–57)

## 2021-03-05 LAB — SEDIMENTATION RATE: Sed Rate: 9 mm/h (ref 0–20)

## 2021-03-05 LAB — ANTI-DNA ANTIBODY, DOUBLE-STRANDED: ds DNA Ab: 3 IU/mL

## 2021-03-05 NOTE — Progress Notes (Signed)
Lab results show no evidence of lupus or connective tissue disease activity at this time. I think no additional workup or treatment should be needed at this time. We can plan to follow up sometime next year after her current pregnancy sometime- in 9 months from now or if she experiences symptoms increase.

## 2021-05-01 LAB — OB RESULTS CONSOLE GBS
GBS: NEGATIVE
GBS: POSITIVE

## 2021-05-14 ENCOUNTER — Telehealth (HOSPITAL_COMMUNITY): Payer: Self-pay | Admitting: *Deleted

## 2021-05-14 NOTE — Telephone Encounter (Signed)
Preadmission screen  

## 2021-05-15 ENCOUNTER — Telehealth (HOSPITAL_COMMUNITY): Payer: Self-pay | Admitting: *Deleted

## 2021-05-15 NOTE — Telephone Encounter (Signed)
Preadmission screen  

## 2021-05-22 ENCOUNTER — Telehealth (HOSPITAL_COMMUNITY): Payer: Self-pay | Admitting: *Deleted

## 2021-05-22 ENCOUNTER — Encounter (HOSPITAL_COMMUNITY): Payer: Self-pay

## 2021-05-22 ENCOUNTER — Encounter (HOSPITAL_COMMUNITY): Payer: Self-pay | Admitting: *Deleted

## 2021-05-22 NOTE — Telephone Encounter (Signed)
Preadmission screen  

## 2021-05-24 ENCOUNTER — Other Ambulatory Visit: Payer: Self-pay | Admitting: Obstetrics and Gynecology

## 2021-05-25 LAB — SARS CORONAVIRUS 2 (TAT 6-24 HRS): SARS Coronavirus 2: NEGATIVE

## 2021-05-26 ENCOUNTER — Inpatient Hospital Stay (HOSPITAL_COMMUNITY): Payer: 59 | Admitting: Anesthesiology

## 2021-05-26 ENCOUNTER — Other Ambulatory Visit: Payer: Self-pay

## 2021-05-26 ENCOUNTER — Encounter (HOSPITAL_COMMUNITY): Payer: Self-pay | Admitting: Obstetrics and Gynecology

## 2021-05-26 ENCOUNTER — Inpatient Hospital Stay (HOSPITAL_COMMUNITY)
Admission: AD | Admit: 2021-05-26 | Discharge: 2021-05-28 | DRG: 807 | Disposition: A | Discharge status: Home / Self Care | Payer: 59 | Attending: Obstetrics and Gynecology | Admitting: Obstetrics and Gynecology

## 2021-05-26 DIAGNOSIS — Z880 Allergy status to penicillin: Secondary | ICD-10-CM | POA: Diagnosis not present

## 2021-05-26 DIAGNOSIS — O26893 Other specified pregnancy related conditions, third trimester: Secondary | ICD-10-CM | POA: Diagnosis present

## 2021-05-26 DIAGNOSIS — O99824 Streptococcus B carrier state complicating childbirth: Secondary | ICD-10-CM | POA: Diagnosis present

## 2021-05-26 DIAGNOSIS — Z3A00 Weeks of gestation of pregnancy not specified: Secondary | ICD-10-CM

## 2021-05-26 DIAGNOSIS — Z3A39 39 weeks gestation of pregnancy: Secondary | ICD-10-CM | POA: Diagnosis not present

## 2021-05-26 DIAGNOSIS — O329XX0 Maternal care for malpresentation of fetus, unspecified, not applicable or unspecified: Secondary | ICD-10-CM

## 2021-05-26 LAB — COMPREHENSIVE METABOLIC PANEL
ALT: 11 U/L (ref 0–44)
AST: 18 U/L (ref 15–41)
Albumin: 3.1 g/dL — ABNORMAL LOW (ref 3.5–5.0)
Alkaline Phosphatase: 149 U/L — ABNORMAL HIGH (ref 38–126)
Anion gap: 12 (ref 5–15)
BUN: 8 mg/dL (ref 6–20)
CO2: 20 mmol/L — ABNORMAL LOW (ref 22–32)
Calcium: 9.3 mg/dL (ref 8.9–10.3)
Chloride: 105 mmol/L (ref 98–111)
Creatinine, Ser: 0.7 mg/dL (ref 0.44–1.00)
GFR, Estimated: >60 mL/min (ref >60)
Glucose, Bld: 83 mg/dL (ref 70–99)
Potassium: 3.6 mmol/L (ref 3.5–5.1)
Sodium: 137 mmol/L (ref 135–145)
Total Bilirubin: 1.3 mg/dL — ABNORMAL HIGH (ref 0.3–1.2)
Total Protein: 6.4 g/dL — ABNORMAL LOW (ref 6.5–8.1)

## 2021-05-26 LAB — CBC
HCT: 36.7 % (ref 36.0–46.0)
Hemoglobin: 12.6 g/dL (ref 12.0–15.0)
MCH: 32.1 pg (ref 26.0–34.0)
MCHC: 34.3 g/dL (ref 30.0–36.0)
MCV: 93.6 fL (ref 80.0–100.0)
Platelets: 207 10*3/uL (ref 150–400)
RBC: 3.92 MIL/uL (ref 3.87–5.11)
RDW: 13.2 % (ref 11.5–15.5)
WBC: 12.6 10*3/uL — ABNORMAL HIGH (ref 4.0–10.5)
nRBC: 0 % (ref 0.0–0.2)

## 2021-05-26 LAB — TYPE AND SCREEN
ABO/RH(D): A POS
Antibody Screen: NEGATIVE

## 2021-05-26 MED ORDER — VANCOMYCIN HCL IN DEXTROSE 1-5 GM/200ML-% IV SOLN
1000.0000 mg | Freq: Two times a day (BID) | INTRAVENOUS | Status: DC
  Administered 2021-05-26: 1000 mg via INTRAVENOUS
  Filled 2021-05-26: qty 200

## 2021-05-26 MED ORDER — OXYTOCIN-SODIUM CHLORIDE 30-0.9 UT/500ML-% IV SOLN
2.5000 [IU]/h | INTRAVENOUS | Status: DC
  Filled 2021-05-26: qty 500

## 2021-05-26 MED ORDER — COCONUT OIL OIL: 1.0000 "application " | TOPICAL_OIL | Status: DC | PRN

## 2021-05-26 MED ORDER — ACETAMINOPHEN 325 MG PO TABS: 650.0000 mg | ORAL_TABLET | ORAL | Status: DC | PRN

## 2021-05-26 MED ORDER — OXYCODONE HCL 5 MG PO TABS: 5.0000 mg | ORAL_TABLET | ORAL | Status: DC | PRN

## 2021-05-26 MED ORDER — EPHEDRINE 5 MG/ML INJ: 10.0000 mg | INTRAVENOUS | Status: DC | PRN

## 2021-05-26 MED ORDER — FENTANYL-BUPIVACAINE-NACL 0.5-0.125-0.9 MG/250ML-% EP SOLN
EPIDURAL | Status: AC
  Filled 2021-05-26: qty 250

## 2021-05-26 MED ORDER — OXYCODONE-ACETAMINOPHEN 5-325 MG PO TABS: 2.0000 | ORAL_TABLET | ORAL | Status: DC | PRN

## 2021-05-26 MED ORDER — ONDANSETRON HCL 4 MG/2ML IJ SOLN: 4.0000 mg | INTRAMUSCULAR | Status: DC | PRN

## 2021-05-26 MED ORDER — LACTATED RINGERS IV SOLN: INTRAVENOUS | Status: DC

## 2021-05-26 MED ORDER — OXYCODONE HCL 5 MG PO TABS: 10.0000 mg | ORAL_TABLET | ORAL | Status: DC | PRN

## 2021-05-26 MED ORDER — ONDANSETRON HCL 4 MG/2ML IJ SOLN: 4.0000 mg | Freq: Four times a day (QID) | INTRAMUSCULAR | Status: DC | PRN

## 2021-05-26 MED ORDER — LACTATED RINGERS IV SOLN: 500.0000 mL | Freq: Once | INTRAVENOUS | Status: DC

## 2021-05-26 MED ORDER — MISOPROSTOL 25 MCG QUARTER TABLET: 25.0000 ug | ORAL_TABLET | ORAL | Status: DC | PRN

## 2021-05-26 MED ORDER — LIDOCAINE HCL (PF) 1 % IJ SOLN: 30.0000 mL | INTRAMUSCULAR | Status: DC | PRN

## 2021-05-26 MED ORDER — IBUPROFEN 600 MG PO TABS
600.0000 mg | ORAL_TABLET | Freq: Four times a day (QID) | ORAL | Status: DC
  Administered 2021-05-26 – 2021-05-28 (×8): 600 mg via ORAL
  Filled 2021-05-26 (×8): qty 1

## 2021-05-26 MED ORDER — SOD CITRATE-CITRIC ACID 500-334 MG/5ML PO SOLN: 30.0000 mL | ORAL | Status: DC | PRN

## 2021-05-26 MED ORDER — PRENATAL MULTIVITAMIN CH
1.0000 | ORAL_TABLET | Freq: Every day | ORAL | Status: DC
  Administered 2021-05-27 – 2021-05-28 (×2): 1 via ORAL
  Filled 2021-05-26 (×2): qty 1

## 2021-05-26 MED ORDER — PHENYLEPHRINE 40 MCG/ML (10ML) SYRINGE FOR IV PUSH (FOR BLOOD PRESSURE SUPPORT)
PREFILLED_SYRINGE | INTRAVENOUS | Status: AC
  Filled 2021-05-26: qty 10

## 2021-05-26 MED ORDER — BUTORPHANOL TARTRATE 1 MG/ML IJ SOLN: 1.0000 mg | INTRAMUSCULAR | Status: DC | PRN

## 2021-05-26 MED ORDER — LACTATED RINGERS IV SOLN
500.0000 mL | INTRAVENOUS | Status: DC | PRN
  Administered 2021-05-26: 500 mL via INTRAVENOUS

## 2021-05-26 MED ORDER — METHYLERGONOVINE MALEATE 0.2 MG/ML IJ SOLN
0.2000 mg | INTRAMUSCULAR | Status: DC | PRN
Start: 2021-05-26 — End: 2021-05-28

## 2021-05-26 MED ORDER — ONDANSETRON HCL 4 MG PO TABS: 4.0000 mg | ORAL_TABLET | ORAL | Status: DC | PRN

## 2021-05-26 MED ORDER — ZOLPIDEM TARTRATE 5 MG PO TABS: 5.0000 mg | ORAL_TABLET | Freq: Every evening | ORAL | Status: DC | PRN

## 2021-05-26 MED ORDER — LACTATED RINGERS IV SOLN
500.0000 mL | INTRAVENOUS | Status: DC | PRN
  Administered 2021-05-26: 1000 mL via INTRAVENOUS

## 2021-05-26 MED ORDER — MAGNESIUM HYDROXIDE 400 MG/5ML PO SUSP: 30.0000 mL | ORAL | Status: DC | PRN

## 2021-05-26 MED ORDER — LIDOCAINE HCL (PF) 1 % IJ SOLN
INTRAMUSCULAR | Status: DC | PRN
  Administered 2021-05-26 (×2): 4 mL via EPIDURAL

## 2021-05-26 MED ORDER — OXYTOCIN-SODIUM CHLORIDE 30-0.9 UT/500ML-% IV SOLN: 2.5000 [IU]/h | INTRAVENOUS | Status: DC

## 2021-05-26 MED ORDER — SIMETHICONE 80 MG PO CHEW: 80.0000 mg | CHEWABLE_TABLET | ORAL | Status: DC | PRN

## 2021-05-26 MED ORDER — DIBUCAINE (PERIANAL) 1 % EX OINT: 1.0000 "application " | TOPICAL_OINTMENT | CUTANEOUS | Status: DC | PRN

## 2021-05-26 MED ORDER — TETANUS-DIPHTH-ACELL PERTUSSIS 5-2.5-18.5 LF-MCG/0.5 IM SUSY: 0.5000 mL | PREFILLED_SYRINGE | Freq: Once | INTRAMUSCULAR | Status: DC

## 2021-05-26 MED ORDER — DIPHENHYDRAMINE HCL 25 MG PO CAPS: 25.0000 mg | ORAL_CAPSULE | Freq: Four times a day (QID) | ORAL | Status: DC | PRN

## 2021-05-26 MED ORDER — METHYLERGONOVINE MALEATE 0.2 MG PO TABS
0.2000 mg | ORAL_TABLET | ORAL | Status: DC | PRN
Start: 2021-05-26 — End: 2021-05-28

## 2021-05-26 MED ORDER — WITCH HAZEL-GLYCERIN EX PADS: 1.0000 "application " | MEDICATED_PAD | CUTANEOUS | Status: DC | PRN

## 2021-05-26 MED ORDER — PHENYLEPHRINE 40 MCG/ML (10ML) SYRINGE FOR IV PUSH (FOR BLOOD PRESSURE SUPPORT): 80.0000 ug | PREFILLED_SYRINGE | INTRAVENOUS | Status: DC | PRN

## 2021-05-26 MED ORDER — OXYCODONE-ACETAMINOPHEN 5-325 MG PO TABS: 1.0000 | ORAL_TABLET | ORAL | Status: DC | PRN

## 2021-05-26 MED ORDER — DIPHENHYDRAMINE HCL 50 MG/ML IJ SOLN
12.5000 mg | INTRAMUSCULAR | Status: DC | PRN
Start: 2021-05-26 — End: 2021-05-26

## 2021-05-26 MED ORDER — MEASLES, MUMPS & RUBELLA VAC IJ SOLR: 0.5000 mL | Freq: Once | INTRAMUSCULAR | Status: DC

## 2021-05-26 MED ORDER — FENTANYL CITRATE (PF) 100 MCG/2ML IJ SOLN: 50.0000 ug | INTRAMUSCULAR | Status: DC | PRN

## 2021-05-26 MED ORDER — OXYTOCIN BOLUS FROM INFUSION: 333.0000 mL | Freq: Once | INTRAVENOUS | Status: DC

## 2021-05-26 MED ORDER — OXYTOCIN BOLUS FROM INFUSION
333.0000 mL | Freq: Once | INTRAVENOUS | Status: AC
  Administered 2021-05-26: 333 mL via INTRAVENOUS

## 2021-05-26 MED ORDER — TERBUTALINE SULFATE 1 MG/ML IJ SOLN: 0.2500 mg | Freq: Once | INTRAMUSCULAR | Status: DC | PRN

## 2021-05-26 MED ORDER — SENNOSIDES-DOCUSATE SODIUM 8.6-50 MG PO TABS
2.0000 | ORAL_TABLET | Freq: Every day | ORAL | Status: DC
  Administered 2021-05-27 – 2021-05-28 (×2): 2 via ORAL
  Filled 2021-05-26 (×2): qty 2

## 2021-05-26 MED ORDER — FENTANYL-BUPIVACAINE-NACL 0.5-0.125-0.9 MG/250ML-% EP SOLN
12.0000 mL/h | EPIDURAL | Status: DC | PRN
  Administered 2021-05-26: 12 mL/h via EPIDURAL

## 2021-05-26 MED ORDER — BENZOCAINE-MENTHOL 20-0.5 % EX AERO
1.0000 "application " | INHALATION_SPRAY | CUTANEOUS | Status: DC | PRN
  Administered 2021-05-26: 1 via TOPICAL
  Filled 2021-05-26: qty 56

## 2021-05-26 NOTE — H&P (Signed)
Diana Yang is a 25 y.o. female, G1P0, EGA 39+ weeks with EDC 10-5 presenting for ctx.  On eval in MAU, VE 5-6 cm.  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  OB History     Gravida  1   Para      Term      Preterm      AB      Living         SAB      IAB      Ectopic      Multiple      Live Births             Past Medical History:  Diagnosis Date   Autoimmune disease ([place])    diagnostics not finished prior to pt moving to [place]   Complication of anesthesia    PTS MOTHER AND GRANDMOTHER SLOW TO WAKE UP   Family history of adverse reaction to anesthesia    Gilbert's syndrome    Per patient   Medullary sponge kidney    Past Surgical History:  Procedure Laterality Date   BREAST BIOPSY Right    2019, 2021   FINE NEEDLE ASPIRATION BIOPSY     kidneys, findings wnl   TONSILLECTOMY AND ADENOIDECTOMY  2015   Family History: family history includes Alcohol abuse in her father and paternal grandfather; Alzheimer's disease in her maternal grandfather; Arthritis in her brother; Asthma in her mother; Breast cancer in her maternal grandmother and paternal grandmother; Depression in her brother; Diabetes in her maternal grandfather; Drug abuse in her father; Early death in her father; Berenice Primas' disease in her father; Hearing loss in her maternal grandmother; Heart attack in her maternal grandmother, mother, and paternal grandmother; Heart disease in her paternal grandmother; High Cholesterol in her maternal grandfather; High blood pressure in her maternal grandmother; Kidney disease in her mother; Liver cancer in her maternal grandfather; Lung cancer in her maternal grandmother; Multiple myeloma in her maternal grandmother; Osteoporosis in her maternal grandmother; Post-traumatic stress disorder in her brother; Stroke in her father; Thyroid disease in her father. Social History:  reports that she has never smoked. She  has never used smokeless tobacco. She reports that she does not currently use alcohol. She reports that she does not use drugs.     Maternal Diabetes: No Genetic Screening: Normal Maternal Ultrasounds/Referrals: Normal Fetal Ultrasounds or other Referrals:  None Maternal Substance Abuse:  No Significant Maternal Medications:  None Significant Maternal Lab Results:  Group B Strep positive Other Comments:  None  Review of Systems  Respiratory: Negative.    Cardiovascular: Negative.   Maternal Medical History:  Reason for admission: Contractions.   Contractions: Frequency: regular.   Perceived severity is moderate.   Fetal activity: Perceived fetal activity is normal.   Prenatal complications: no prenatal complications Prenatal Complications - Diabetes: none.  Dilation: 5.5 Effacement (%): 90 Station: -1 Exam by:: [employer] RN Blood pressure 107/70, pulse 74, temperature 97.9 F (36.6 C), temperature source Oral, resp. rate 16, height 5' 7"  (1.702 m), weight 81.6 kg, last menstrual period 05/04/2020, SpO2 100 %, unknown if currently breastfeeding. Maternal Exam:  Uterine Assessment: Contraction strength is moderate.  Contraction frequency is regular.  Abdomen: Patient reports no abdominal tenderness. Estimated fetal weight is 7.5 lbs.   Fetal presentation: vertex Introitus: Normal vulva. Normal vagina.  Amniotic fluid character: not assessed. Pelvis: adequate for delivery.     Fetal Exam Fetal Monitor Review:  Mode: ultrasound.   Baseline rate: 120.  Variability: moderate (6-25 bpm).   Pattern: accelerations present and no decelerations.   Fetal State Assessment: Category I - tracings are normal.  Physical Exam Vitals reviewed.  Constitutional:      Appearance: Normal appearance.  Cardiovascular:     Rate and Rhythm: Normal rate and regular rhythm.  Pulmonary:     Effort: Pulmonary effort is normal. No respiratory distress.  Abdominal:     Palpations: Abdomen is  soft.  Genitourinary:    General: Normal vulva.  Neurological:     Mental Status: She is alert.    Prenatal labs: ABO, Rh: --/--/A POS (10/01 0915) Antibody: NEG (10/01 0915) Rubella: Immune (03/14 0000) RPR: Nonreactive (03/14 0000)  HBsAg: Negative (03/14 0000)  HIV: Non-reactive (03/14 0000)  GBS: Negative, Positive/-- (09/06 0000) POSITIVE  Assessment/Plan: IUP at 39+ weeks in active labor, +GBS with PCN allergy, resistant to [place].  Will admit, Vancomycin for +GBS, monitor progress, anticipate SVD   Diana Yang 05/26/2021, 11:13 AM

## 2021-05-26 NOTE — Progress Notes (Addendum)
Pt's chart states pt is GBS negative in the results console. However, MD Meisinger at bedside upon admission stating pt is GBS positive and will need vancomycin due to PCN allergy. Pt's prenatal records reviewed by this RN and pt's GBS was POSITIVE on 9/6 per prental record. Pt is currently being treated with Vancomycin.

## 2021-05-26 NOTE — Lactation Note (Signed)
This note was copied from a baby's chart. Lactation Consultation Note  Patient Name: Diana Yang YOVZC'H Date: 05/26/2021 Reason for consult: Initial assessment;1st time breastfeeding;Term Age:24 hours P1, term female infant. Mom in process of ordering a DEBP with her insurance. Mom was holding infant swaddle in blankets when LC entered the room, infant asleep. Per mom, she feels breastfeeding is going well, infant recently breastfeed, LC did not observe latch. Per mom, infant has breastfeed for 15 minutes in L&D, then 6 minutes and lastly 12 minutes with recent feeding. Mom doesn't have any questions or concerns for LC at this time. Mom made aware of O/P services, breastfeeding support groups, community resources, and our phone # for post-discharge questions.   Mom's plan: 1-Mom knows to breastfeed infant according to feeding cues, 8 to 12+ or more times within 24 hours, skin to skin. 2-Mom knows to call RN/LC if she has any questions, concerns or needs assistance with latching infant at the breast.  3- LC encouraged mom to do lots of skin to skin with infant. Maternal Data Has patient been taught Hand Expression?: Yes Does the patient have breastfeeding experience prior to this delivery?: No  Feeding Mother's Current Feeding Choice: Breast Milk  LATCH Score                    Lactation Tools Discussed/Used    Interventions Interventions: Breast feeding basics reviewed;Skin to skin;Hand express;Education  Discharge [place] Program: No  Consult Status Consult Status: Follow-up Date: 05/27/21 Follow-up type: In-patient    Vicente Serene 05/26/2021, 10:09 PM

## 2021-05-26 NOTE — Progress Notes (Signed)
Pt informed that the ultrasound is considered a limited OB ultrasound and is not intended to be a complete ultrasound exam.  Patient also informed that the ultrasound is not being completed with the intent of assessing for fetal or placental anomalies or any pelvic abnormalities.  Explained that the purpose of today's ultrasound is to assess for  presentation.  Patient acknowledges the purpose of the exam and the limitations of the study.    Cephalic  Breanah Faddis, NP   

## 2021-05-26 NOTE — Anesthesia Procedure Notes (Signed)
Epidural Patient location during procedure: OB Start time: 05/26/2021 9:58 AM End time: 05/26/2021 10:01 AM  Staffing Anesthesiologist: Brennan Bailey, MD Performed: anesthesiologist   Preanesthetic Checklist Completed: patient identified, IV checked, risks and benefits discussed, monitors and equipment checked, pre-op evaluation and timeout performed  Epidural Patient position: sitting Prep: DuraPrep and site prepped and draped Patient monitoring: continuous pulse ox, blood pressure and heart rate Approach: midline Location: L3-L4 Injection technique: LOR air  Needle:  Needle type: Tuohy  Needle gauge: 17 G Needle length: 9 cm Catheter type: closed end flexible Catheter size: 19 Gauge Catheter at skin depth: 9 cm Test dose: negative and Other (1% lidocaine)  Assessment Events: blood not aspirated, injection not painful, no injection resistance, no paresthesia and negative IV test  Additional Notes Patient identified. Risks, benefits, and alternatives discussed with patient including but not limited to bleeding, infection, nerve damage, paralysis, failed block, incomplete pain control, headache, blood pressure changes, nausea, vomiting, reactions to medication, itching, and postpartum back pain. Confirmed with bedside nurse the patient's most recent platelet count. Confirmed with patient that they are not currently taking any anticoagulation, have any bleeding history, or any family history of bleeding disorders. Patient expressed understanding and wished to proceed. All questions were answered. Sterile technique was used throughout the entire procedure. Please see nursing notes for vital signs.   Crisp LOR on first pass. Test dose was given through epidural catheter and negative prior to continuing to dose epidural or start infusion. Warning signs of high block given to the patient including shortness of breath, tingling/numbness in hands, complete motor block, or any concerning  symptoms with instructions to call for help. Patient was given instructions on fall risk and not to get out of bed. All questions and concerns addressed with instructions to call with any issues or inadequate analgesia.  Reason for block:procedure for pain

## 2021-05-26 NOTE — Lactation Note (Signed)
This note was copied from a baby's chart. Lactation Consultation Note  Patient Name: Diana Yang Date: 05/26/2021 Reason for consult: L&D Initial assessment Age:24 hours  L&D consult with <60 minutes old infant and P1 mother. Congratulated family on newborn.  Offered assistance with latch, laid back position. Infant latched and is still breastfeeding upon [place] leaving room. Discussed STS as ideal transition for infants after birth helping with temperature, blood sugar and comfort. Talked about primal reflexes such as rooting, hands to mouth, searching for the breast among others. Explained [place] services availability during postpartum stay. Thanked family for their time.      Maternal Data Has patient been taught Hand Expression?: Yes Does the patient have breastfeeding experience prior to this delivery?: No  Feeding Mother's Current Feeding Choice: Breast Milk  LATCH Score Latch: Grasps breast easily, tongue down, lips flanged, rhythmical sucking.  Audible Swallowing: Spontaneous and intermittent  Type of Nipple: Everted at rest and after stimulation  Comfort (Breast/Nipple): Soft / non-tender  Hold (Positioning): Assistance needed to correctly position infant at breast and maintain latch.  LATCH Score: 9  Interventions Interventions: Breast feeding basics reviewed;Assisted with latch;Skin to skin;Hand express;Expressed milk  Discharge    Consult Status Consult Status: Follow-up from L&D    [place] 05/26/2021, 4:29 PM

## 2021-05-26 NOTE — MAU Note (Signed)
...  Diana Yang is a 25 y.o. at [redacted]w[redacted]d here in MAU reporting: CTX for the past couple of hours. Denies LOF but endorses bloody show. States she has not felt her baby move since 0400 this morning. 2 cm this past Thursday. Scheduled induction for Oct 3rd.   GBS+ PCN allergic  Lab orders placed from triage: MAU Labor Eval

## 2021-05-26 NOTE — Progress Notes (Signed)
Comfortable with epidural Afeb, VSS FHT-120s, Cat I, ctx q 2-3 min VE-9/C/+1, vtx, AROM clear Monitor progress, anticipate SVD

## 2021-05-26 NOTE — Anesthesia Preprocedure Evaluation (Signed)
Anesthesia Evaluation  Patient identified by MRN, date of birth, ID band Patient awake    Reviewed: Allergy & Precautions, Patient's Chart, lab work & pertinent test results  History of Anesthesia Complications Negative for: history of anesthetic complications  Airway Mallampati: II  TM Distance: >3 FB Neck ROM: Full    Dental no notable dental hx.    Pulmonary neg pulmonary ROS,    Pulmonary exam normal        Cardiovascular negative cardio ROS Normal cardiovascular exam     Neuro/Psych negative neurological ROS  negative psych ROS   GI/Hepatic negative GI ROS, Neg liver ROS,   Endo/Other  negative endocrine ROS  Renal/GU negative Renal ROS  negative genitourinary   Musculoskeletal negative musculoskeletal ROS (+)   Abdominal   Peds  Hematology negative hematology ROS (+)   Anesthesia Other Findings SLE  Reproductive/Obstetrics (+) Pregnancy                             Anesthesia Physical Anesthesia Plan  ASA: 2  Anesthesia Plan: Epidural   Post-op Pain Management:    Induction:   PONV Risk Score and Plan: Treatment may vary due to age or medical condition  Airway Management Planned: Natural Airway  Additional Equipment:   Intra-op Plan:   Post-operative Plan:   Informed Consent: I have reviewed the patients History and Physical, chart, labs and discussed the procedure including the risks, benefits and alternatives for the proposed anesthesia with the patient or authorized representative who has indicated his/her understanding and acceptance.       Plan Discussed with:   Anesthesia Plan Comments:         Anesthesia Quick Evaluation

## 2021-05-27 ENCOUNTER — Encounter (HOSPITAL_COMMUNITY): Payer: Self-pay | Admitting: Obstetrics and Gynecology

## 2021-05-27 LAB — RPR: RPR Ser Ql: NONREACTIVE

## 2021-05-27 NOTE — Progress Notes (Signed)
PPD #1 No problems Afeb, VSS Fundus firm, NT at U-1 Continue routine postpartum care 

## 2021-05-27 NOTE — Lactation Note (Signed)
This note was copied from a baby's chart. Lactation Consultation Note  Patient Name: Girl Rosaly Labarbera BZXYD'S Date: 05/27/2021 Reason for consult: Follow-up assessment Age:25  LC in to room for follow up. Mother states breastfeeding is going well. Per mother, infant is clusterfeeding. Talked about weight loss, voids and stools as signs of good milk transfer. Encouraged hand expression to "top infant up".    Plan: 1-Breastfeeding on demand or 8-12 times in 24h period. 2-Encouraged maternal rest, hydration and food intake.   Contact LC as needed for feeds/support/concerns/questions. All questions answered at this time.    Feeding Mother's Current Feeding Choice: Breast Milk  LATCH Score Latch: Grasps breast easily, tongue down, lips flanged, rhythmical sucking.  Audible Swallowing: Spontaneous and intermittent  Type of Nipple: Everted at rest and after stimulation  Comfort (Breast/Nipple): Soft / non-tender  Hold (Positioning): No assistance needed to correctly position infant at breast.  LATCH Score: 10  Interventions Interventions: Breast feeding basics reviewed;Skin to skin;Expressed milk;Hand express;Education  Consult Status Consult Status: Follow-up Date: 05/28/21 Follow-up type: In-patient    Norwood Quezada A Higuera Ancidey 05/27/2021, 9:40 PM

## 2021-05-27 NOTE — Anesthesia Postprocedure Evaluation (Signed)
Anesthesia Post Note  Patient: Diana Yang  Procedure(s) Performed: AN AD HOC LABOR EPIDURAL     Anesthesia Post Evaluation Good pain control, no complications.   Last Vitals:  Vitals:   05/27/21 0331 05/27/21 0727  BP: 97/61 98/64  Pulse: 81 85  Resp: 15 18  Temp: 36.8 C 36.8 C  SpO2: 98% 99%    Last Pain:  Vitals:   05/27/21 0727  TempSrc: Oral  PainSc:    Pain Goal:  2                 Myna Bright

## 2021-05-28 ENCOUNTER — Inpatient Hospital Stay (HOSPITAL_COMMUNITY): Payer: 59

## 2021-05-28 ENCOUNTER — Inpatient Hospital Stay (HOSPITAL_COMMUNITY): Admission: AD | Admit: 2021-05-28 | Payer: 59 | Source: Home / Self Care | Admitting: Obstetrics and Gynecology

## 2021-05-28 MED ORDER — ACETAMINOPHEN 325 MG PO TABS: 650.0000 mg | ORAL_TABLET | Freq: Four times a day (QID) | ORAL | Status: DC | PRN

## 2021-05-28 MED ORDER — IBUPROFEN 200 MG PO TABS: 600.0000 mg | ORAL_TABLET | Freq: Four times a day (QID) | ORAL | Status: DC | PRN

## 2021-05-28 NOTE — Lactation Note (Signed)
This note was copied from a baby's chart. Lactation Consultation Note  Patient Name: Diana Yang HUTML'Y Date: 05/28/2021 Reason for consult: Follow-up assessment;Primapara;1st time breastfeeding;Term;Infant weight loss;Other (Comment);Nipple pain/trauma;Breastfeeding assistance (( 9 % weight loss, Bili WNL ) voids and stool correlate with weight loss, milk is coming  in per mom) Age:24 hours LC reviewed with mom and dad basics of breast feeding  LC plan  Feed with feeding cues and by 3 hours due to 9 % weight loss ( 8-12 times a day )  STS feedings until the baby is back to birth weight and gaining well  and can stay awake for majority of the feeding. Sore nipple and engorgement prevention and tx  Mom receptive to assist with latch/ right breast / football.  Mom has areola edema . LC showed mom the reverse pressure technique and prior to baby latching . Baby latched with depth, per mom comfortable and sustained the latch for 27 mins with multiple swallows, breast softer - Latch score 8 - see below.  Per mom and dad feel more comfortable with breast feeding information after consultation.   Maternal Data Has patient been taught Hand Expression?: Yes  Feeding Mother's Current Feeding Choice: Breast Milk  LATCH Score Latch: Grasps breast easily, tongue down, lips flanged, rhythmical sucking.  Audible Swallowing: Spontaneous and intermittent  Type of Nipple: Everted at rest and after stimulation  Comfort (Breast/Nipple): Filling, red/small blisters or bruises, mild/mod discomfort  Hold (Positioning): Assistance needed to correctly position infant at breast and maintain latch.  LATCH Score: 8   Lactation Tools Discussed/Used Tools: Shells;Pump;Flanges Flange Size: 24;27 (#24 F fits well for today) Breast pump type: Manual Pump Education: Milk Storage;Setup, frequency, and cleaning Reason for Pumping: PRN  Interventions Interventions: Breast feeding basics  reviewed;Assisted with latch;Skin to skin;Breast massage;Hand express;Breast compression;Adjust position;Support pillows;Position options;Expressed milk;Shells;Hand pump;Education;LC Services brochure  Discharge Discharge Education: Engorgement and breast care Pump: Manual;Personal (per mom plans to call insurance company for her DEBP)  Consult Status Consult Status: Complete Date: 05/28/21    Diana Yang 05/28/2021, 10:39 AM

## 2021-05-28 NOTE — Progress Notes (Signed)
Post Partum Day 2 Subjective: No complaints. Ambulating, voiding, tolerating PO. Breastfeeding. Minimal lochia  Objective: Patient Vitals for the past 24 hrs:  BP Temp Temp src Pulse Resp SpO2  05/28/21 0543 94/60 98.4 F (36.9 C) Oral 64 13 97 %  05/27/21 1945 94/61 98.7 F (37.1 C) -- 88 16 97 %  05/27/21 1430 95/64 98.2 F (36.8 C) Oral 79 16 --    Physical Exam:  General: alert, cooperative, and no distress Lochia: appropriate Uterine Fundus: firm DVT Evaluation: No evidence of DVT seen on physical exam.  Recent Labs    05/26/21 0912  WBC 12.6*  HGB 12.6  HCT 36.7  PLT 207    Recent Labs    05/26/21 0912  NA 137  K 3.6  CL 105  BUN 8  CREATININE 0.70  GLUCOSE 83  BILITOT 1.3*  ALT 11  AST 18  ALKPHOS 149*  PROT 6.4*  ALBUMIN 3.1*    Recent Labs    05/26/21 0912  CALCIUM 9.3    No results for input(s): PROTIME, APTT, INR in the last 72 hours.  No results for input(s): PROTIME, APTT, INR, FIBRINOGEN in the last 72 hours. Assessment/Plan:  Diana Yang 25 y.o. G1P1001 PPD#2 sp SVD 1. PPC:  Routine PP care 2. Rh positive, rubella immune. S/p tdap prenatally 3. Dispo: meeting pp milestones, discharge home. Instructions reviewed   LOS: 2 days   Rowland Lathe 05/28/2021, 11:43 AM

## 2021-05-28 NOTE — Discharge Summary (Signed)
Postpartum Discharge Summary  Date of Service updated. 05/28/21      Patient Name: Diana Yang DOB: Aug 15, 1996 MRN: [id number]  Date of admission: 05/26/2021 Delivery date:05/26/2021  Delivering provider: Willis Modena, TODD  Date of discharge: 05/28/2021  Admitting diagnosis: Indication for care in labor or delivery [O75.9] [redacted] weeks gestation of pregnancy [Z3A.39] Intrauterine pregnancy: 102w3d    Secondary diagnosis:  Active Problems:   SVD (spontaneous vaginal delivery)   [redacted] weeks gestation of pregnancy  Additional problems: none    Discharge diagnosis: Term Pregnancy Delivered                                              Post partum procedures: none Augmentation: AROM Complications: None  Hospital course: Onset of Labor With Vaginal Delivery      25y.o. yo G1P1001 at 339w3das admitted in Active Labor on 05/26/2021. Patient had an uncomplicated labor course as follows:  Membrane Rupture Time/Date: 2:26 PM ,05/26/2021   Delivery Method:Vaginal, Spontaneous  Episiotomy: None  Lacerations:  Perineal;2nd degree  Patient had an uncomplicated postpartum course.  She is ambulating, tolerating a regular diet, passing flatus, and urinating well. Patient is discharged home in stable condition on 05/28/21.  Newborn Data: Birth date:05/26/2021  Birth time:3:45 PM  Gender:Female  Living status:Living  Apgars:8 ,9  Weight:3595 g   Magnesium Sulfate received: No BMZ received: No Rhophylac:N/A MMR:N/A T-DaP:Given prenatally Flu: No Transfusion:No  Physical exam  Vitals:   05/27/21 0727 05/27/21 1430 05/27/21 1945 05/28/21 0543  BP: 98/64 95/64 94/61  94/60  Pulse: 85 79 88 64  Resp: 18 16 16 13   Temp: 98.3 F (36.8 C) 98.2 F (36.8 C) 98.7 F (37.1 C) 98.4 F (36.9 C)  TempSrc: Oral Oral  Oral  SpO2: 99%  97% 97%  Weight:      Height:       General: alert, cooperative, and no distress Lochia: appropriate Uterine Fundus: firm Incision: N/A DVT Evaluation: No  evidence of DVT seen on physical exam. Labs: Lab Results  Component Value Date   WBC 12.6 (H) 05/26/2021   HGB 12.6 05/26/2021   HCT 36.7 05/26/2021   MCV 93.6 05/26/2021   PLT 207 05/26/2021   CMP Latest Ref Rng & Units 05/26/2021  Glucose 70 - 99 mg/dL 83  BUN 6 - 20 mg/dL 8  Creatinine 0.44 - 1.00 mg/dL 0.70  Sodium 135 - 145 mmol/L 137  Potassium 3.5 - 5.1 mmol/L 3.6  Chloride 98 - 111 mmol/L 105  CO2 22 - 32 mmol/L 20(L)  Calcium 8.9 - 10.3 mg/dL 9.3  Total Protein 6.5 - 8.1 g/dL 6.4(L)  Total Bilirubin 0.3 - 1.2 mg/dL 1.3(H)  Alkaline Phos 38 - 126 U/L 149(H)  AST 15 - 41 U/L 18  ALT 0 - 44 U/L 11   Edinburgh Score: Edinburgh Postnatal Depression Scale Screening Tool 05/28/2021  I have been able to laugh and see the funny side of things. (No Data)      After visit meds:  Allergies as of 05/28/2021       Reactions   Banana Anaphylaxis   Penicillins Hives, Itching, Nausea And Vomiting, Rash, Shortness Of Breath        Medication List     TAKE these medications    acetaminophen 325 MG tablet Commonly known as: Tylenol Take 2 tablets (650  mg total) by mouth every 6 (six) hours as needed (for pain scale < 4).   ibuprofen 200 MG tablet Commonly known as: ADVIL Take 3 tablets (600 mg total) by mouth every 6 (six) hours as needed.   prenatal multivitamin Tabs tablet Take 1 tablet by mouth daily at 12 noon.         Discharge home in stable condition Infant Feeding: Breast Infant Disposition:home with mother Discharge instruction: per After Visit Summary and Postpartum booklet. Activity: Advance as tolerated. Pelvic rest for 6 weeks.  Diet: routine diet Anticipated Birth Control: Unsure Postpartum Appointment:4 weeks Additional Postpartum F/U:  none Future Appointments:No future appointments. Follow up Visit:  Follow-up Information     Rowland Lathe, MD. Schedule an appointment as soon as possible for a visit in 4 week(s).   Specialty:  Obstetrics and Gynecology Contact information: [address] [place] [address] [phone]                     05/28/2021 Rowland Lathe, MD

## 2021-06-06 ENCOUNTER — Telehealth (HOSPITAL_COMMUNITY): Payer: Self-pay | Admitting: *Deleted

## 2021-06-06 NOTE — Telephone Encounter (Signed)
Mom reports feeling good. No concerns about herself. EPDS=8 (no hospital score) Mom reports baby is well, but not sleeping very much. Feeding, peeing, and pooping without difficulty. Sleeps in bassinet in mom's room on back. Safe sleep reviewed. Mom has no concerns about baby at this time.  Odis Hollingshead, RN 06-06-2021 at 1:52pm

## 2021-06-18 NOTE — Telephone Encounter (Signed)
This concern has been previously addressed by myself and/or another provider.  If they patient has ongoing concerns, they can contact me at their convenience.  Thank you,  Rich Vlada Uriostegui, NP 

## 2021-07-10 ENCOUNTER — Other Ambulatory Visit: Payer: Self-pay | Admitting: Registered Nurse

## 2021-07-10 DIAGNOSIS — Z9889 Other specified postprocedural states: Secondary | ICD-10-CM

## 2021-07-10 DIAGNOSIS — Z09 Encounter for follow-up examination after completed treatment for conditions other than malignant neoplasm: Secondary | ICD-10-CM

## 2021-08-03 ENCOUNTER — Ambulatory Visit
Admission: RE | Admit: 2021-08-03 | Discharge: 2021-08-03 | Disposition: A | Discharge status: Home / Self Care | Payer: 59 | Source: Ambulatory Visit | Attending: Registered Nurse | Admitting: Registered Nurse

## 2021-08-03 ENCOUNTER — Other Ambulatory Visit: Payer: Self-pay | Admitting: Registered Nurse

## 2021-08-03 DIAGNOSIS — Z09 Encounter for follow-up examination after completed treatment for conditions other than malignant neoplasm: Secondary | ICD-10-CM

## 2021-08-03 DIAGNOSIS — Z9889 Other specified postprocedural states: Secondary | ICD-10-CM

## 2021-09-13 ENCOUNTER — Encounter: Payer: Self-pay | Admitting: Registered Nurse

## 2021-09-13 ENCOUNTER — Telehealth (INDEPENDENT_AMBULATORY_CARE_PROVIDER_SITE_OTHER): Payer: 59 | Admitting: Registered Nurse

## 2021-09-13 VITALS — Ht 67.0 in | Wt 180.0 lb

## 2021-09-13 DIAGNOSIS — R1013 Epigastric pain: Secondary | ICD-10-CM | POA: Diagnosis not present

## 2021-09-13 MED ORDER — OMEPRAZOLE 20 MG PO CPDR: 20.0000 mg | DELAYED_RELEASE_CAPSULE | Freq: Every day | ORAL | 3 refills | Status: DC

## 2021-09-13 NOTE — Progress Notes (Signed)
Telemedicine Encounter- SOAP NOTE Established Patient  This video encounter was conducted with the patient's (or proxy's) verbal consent via audio telecommunications: yes/no: Yes Patient was instructed to have this encounter in a suitably private space; and to only have persons present to whom they give permission to participate. In addition, patient identity was confirmed by use of name plus two identifiers (DOB and address).  I discussed the limitations, risks, security and privacy concerns of performing an evaluation and management service by telephone and the availability of in person appointments. I also discussed with the patient that there may be a patient responsible charge related to this service. The patient expressed understanding and agreed to proceed.  I spent a total of 14 minutes talking with the patient or their proxy.  Patient at home Provider in office  Participants: Kathrin Ruddy, NP and Jerilee Hoh  Chief Complaint  Patient presents with   Acute Visit    Abdominal pain x days located above the belly button. No N/V/D. Pt is currently breastfeeding and has been for 3 months.    Subjective   Diana Yang is a 26 y.o. established patient. Video visit today for abdominal pain  HPI Center of abdomen, above umbilicus Onset 3 days ago. Episodic - occurred a number of times during each day. More often in the mornings and at nights. Feels like pressure type pain - feels like contractions in early labor.  With the pain is lightheadedness, dizziness. Lasts for 10 seconds at a time.  No nvd, no melena, no blood or mucus in stool. No unexpected weight changes.  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     omeprazole (PRILOSEC) 20 MG capsule Take 1 capsule (20 mg total) by mouth daily. 30 capsule 3   Prenatal Vit-Fe Fumarate-FA (PRENATAL MULTIVITAMIN) TABS tablet Take 1 tablet by mouth daily at 12 noon.     acetaminophen (TYLENOL) 325 MG tablet Take 2 tablets (650 mg total) by mouth every 6 (six) hours as needed (for pain scale < 4). (Patient not taking: Reported on 09/13/2021)     ibuprofen (ADVIL) 200 MG tablet Take 3 tablets (600 mg total) by mouth every 6 (six) hours as needed. (Patient not taking: Reported on 09/13/2021)     No current facility-administered medications for this visit.    Allergies  Allergen Reactions   Banana Anaphylaxis   Penicillins Hives, Itching, Nausea And Vomiting, Rash and Shortness Of Breath    Social History   Socioeconomic History   Marital status: Significant Other  Spouse name: Not on file   Number of children: 0   Years of education: Not on file   Highest education level: Not on file  Occupational History   Occupation: med lab  Tobacco Use   Smoking status: Never   Smokeless tobacco: Never  Vaping Use   Vaping Use: Never used  Substance and Sexual Activity   Alcohol use: Not Currently    Comment: prior to prgegnancy 4 drinks 1 week   Drug use: Never   Sexual activity: Yes  Other Topics Concern   Not on file  Social History  Narrative   From [place]. Was down here for college, recently returned after being back in [place] for a bit.   Social Determinants of Health   Financial Resource Strain: Not on file  Food Insecurity: Not on file  Transportation Needs: Not on file  Physical Activity: Not on file  Stress: Not on file  Social Connections: Not on file  Intimate Partner Violence: Not on file    Review of Systems  Constitutional: Negative.   HENT: Negative.    Eyes: Negative.   Respiratory: Negative.    Cardiovascular: Negative.   Gastrointestinal:  Positive for abdominal pain. Negative for blood in stool, constipation, diarrhea, heartburn, melena, nausea and vomiting.  Genitourinary: Negative.   Musculoskeletal: Negative.   Skin: Negative.   Neurological: Negative.   Endo/Heme/Allergies: Negative.   Psychiatric/Behavioral: Negative.    All other systems reviewed and are negative.  Objective   Vitals as reported by the patient: Today's Vitals   09/13/21 1231  Weight: 180 lb (81.6 kg)  Height: 5\' 7"  (1.702 m)    Diana Yang was seen today for acute visit.  Diagnoses and all orders for this visit:  Epigastric pain -     omeprazole (PRILOSEC) 20 MG capsule; Take 1 capsule (20 mg total) by mouth daily.    PLAN Unclear etiology. Perhaps gastritis. Will treat with low dose omeprazole for a few days and have her monitor diet and symptoms. Return if worsening or failing to improve. ER precautions reviewed. Pt voices understanding Patient encouraged to call clinic with any questions, comments, or concerns.  I discussed the assessment and treatment plan with the patient. The patient was provided an opportunity to ask questions and all were answered. The patient agreed with the plan and demonstrated an understanding of the instructions.   The patient was advised to call back or seek an in-person evaluation if the symptoms worsen or if the condition fails to improve as anticipated.  I provided 14  minutes of non-face-to-face time during this encounter.  Maximiano Coss, NP

## 2021-10-25 ENCOUNTER — Encounter: Payer: Self-pay | Admitting: Registered Nurse

## 2021-10-29 NOTE — Telephone Encounter (Signed)
I have not seen in medical records for this patient. Did you see them by any chance. ? ?

## 2021-11-12 ENCOUNTER — Ambulatory Visit: Payer: 59 | Admitting: Registered Nurse

## 2021-11-12 ENCOUNTER — Encounter: Payer: Self-pay | Admitting: Registered Nurse

## 2021-11-12 VITALS — BP 99/62 | HR 88 | Temp 98.0°F | Resp 18 | Ht 67.0 in | Wt 152.0 lb

## 2021-11-12 DIAGNOSIS — R1013 Epigastric pain: Secondary | ICD-10-CM | POA: Diagnosis not present

## 2021-11-12 DIAGNOSIS — R1114 Bilious vomiting: Secondary | ICD-10-CM

## 2021-11-12 DIAGNOSIS — K5904 Chronic idiopathic constipation: Secondary | ICD-10-CM | POA: Diagnosis not present

## 2021-11-12 LAB — COMPREHENSIVE METABOLIC PANEL
ALT: 9 U/L (ref 0–35)
AST: 12 U/L (ref 0–37)
Albumin: 4.4 g/dL (ref 3.5–5.2)
Alkaline Phosphatase: 59 U/L (ref 39–117)
BUN: 15 mg/dL (ref 6–23)
CO2: 29 mEq/L (ref 19–32)
Calcium: 9.5 mg/dL (ref 8.4–10.5)
Chloride: 100 mEq/L (ref 96–112)
Creatinine, Ser: 0.81 mg/dL (ref 0.40–1.20)
GFR: 101.15 mL/min (ref >60.00)
Glucose, Bld: 71 mg/dL (ref 70–99)
Potassium: 3.3 mEq/L — ABNORMAL LOW (ref 3.5–5.1)
Sodium: 139 mEq/L (ref 135–145)
Total Bilirubin: 2.8 mg/dL — ABNORMAL HIGH (ref 0.2–1.2)
Total Protein: 6.7 g/dL (ref 6.0–8.3)

## 2021-11-12 LAB — CBC WITH DIFFERENTIAL/PLATELET
Basophils Absolute: 0.1 10*3/uL (ref 0.0–0.1)
Basophils Relative: 0.6 % (ref 0.0–3.0)
Eosinophils Absolute: 0.2 10*3/uL (ref 0.0–0.7)
Eosinophils Relative: 2.1 % (ref 0.0–5.0)
HCT: 38.3 % (ref 36.0–46.0)
Hemoglobin: 13.7 g/dL (ref 12.0–15.0)
Lymphocytes Relative: 22.1 % (ref 12.0–46.0)
Lymphs Abs: 2.2 10*3/uL (ref 0.7–4.0)
MCHC: 35.9 g/dL (ref 30.0–36.0)
MCV: 85.2 fl (ref 78.0–100.0)
Monocytes Absolute: 0.8 10*3/uL (ref 0.1–1.0)
Monocytes Relative: 8.2 % (ref 3.0–12.0)
Neutro Abs: 6.7 10*3/uL (ref 1.4–7.7)
Neutrophils Relative %: 67 % (ref 43.0–77.0)
Platelets: 237 10*3/uL (ref 150.0–400.0)
RBC: 4.49 Mil/uL (ref 3.87–5.11)
RDW: 13 % (ref 11.5–15.5)
WBC: 9.9 10*3/uL (ref 4.0–10.5)

## 2021-11-12 LAB — URINALYSIS, ROUTINE W REFLEX MICROSCOPIC
Bilirubin Urine: NEGATIVE
Ketones, ur: NEGATIVE
Nitrite: NEGATIVE
Specific Gravity, Urine: 1.02 (ref 1.000–1.030)
Urine Glucose: NEGATIVE
Urobilinogen, UA: 1 (ref 0.0–1.0)
pH: 6.5 (ref 5.0–8.0)

## 2021-11-12 LAB — TSH: TSH: 2.06 u[IU]/mL (ref 0.35–5.50)

## 2021-11-12 LAB — T4, FREE: Free T4: 0.97 ng/dL (ref 0.60–1.60)

## 2021-11-12 MED ORDER — ONDANSETRON 4 MG PO TBDP: 4.0000 mg | ORAL_TABLET | Freq: Three times a day (TID) | ORAL | 0 refills | Status: DC | PRN

## 2021-11-12 NOTE — Addendum Note (Signed)
Addended by: Maximiano Coss on: 11/12/2021 01:54 PM ? ? Modules accepted: Orders ? ?

## 2021-11-12 NOTE — Progress Notes (Signed)
? ?Established Patient Office Visit ? ?Subjective:  ?Patient ID: Diana Yang, female    DOB: [birth year]  Age: 26 y.o. MRN: [id number] ? ?CC:  ?Chief Complaint  ?Patient presents with  ? Abdominal Pain  ?  Patient states she has been having some stomach pain last night that prevented her from going back to sleep. Patient states around 5 am it slowed down but started vomiting and has some chills.  ? ? ?HPI ?Diana Yang presents for abdominal pain ? ?Acute onset last night. Stopped her from being able to sleep. ?Felt it got to a 9/10 pain. Describes as cramping worse than labor, intermittent stabbing.  ?Woke up with 2, pain lasted about 4 hours. Started again around 8, on and off since.  ?Led to vomiting and chills - happened 4-5 times throughout the next few hours. Most recently vomited at 7:30am.  ? ?Notes hx of constipation - 1 bowel movement each week, maybe longer in between.  ?Has been 1-2 per week since giving birth in Oct  ? ?Partner ate same food last night, is not sick.  ?Of note, seen via [facility] in Jan for presumed gastritis, given omeprazole. Took for a few days, helped, then stopped taking.  ? ?Past Medical History:  ?Diagnosis Date  ? Autoimmune disease ([place])   ? diagnostics not finished prior to pt moving to [place]  ? Complication of anesthesia   ? PTS MOTHER AND GRANDMOTHER SLOW TO WAKE UP  ? Family history of adverse reaction to anesthesia   ? Gilbert's syndrome   ? Per patient  ? Medullary sponge kidney   ? ? ?Past Surgical History:  ?Procedure Laterality Date  ? BREAST BIOPSY Right   ? 2019, 2021  ? FINE NEEDLE ASPIRATION BIOPSY    ? kidneys, findings wnl  ? TONSILLECTOMY AND ADENOIDECTOMY  2015  ? ? ?Family History  ?Problem Relation Age of Onset  ? Heart attack Mother   ?     Sudden cardiac arrest  ? Kidney disease Mother   ? Asthma Mother   ? Alcohol abuse Father   ? Drug abuse Father   ? Early death Father   ?     61  ? Stroke Father   ?     Thyroid storm  ? Graves' disease Father   ? Thyroid  disease Father   ? Arthritis Brother   ?     OA - from [place]  ? Depression Brother   ? Post-traumatic stress disorder Brother   ?     Marines  ? Breast cancer Maternal Grandmother   ? Lung cancer Maternal Grandmother   ?     smoker - but "not from smoking"  ? Multiple myeloma Maternal Grandmother   ?     "rare blood cancer"  ? High blood pressure Maternal Grandmother   ? Heart attack Maternal Grandmother   ? Hearing loss Maternal Grandmother   ? Osteoporosis Maternal Grandmother   ? Liver cancer Maternal Grandfather   ? Diabetes Maternal Grandfather   ? High Cholesterol Maternal Grandfather   ? Alzheimer's disease Maternal Grandfather   ? Breast cancer Paternal Grandmother   ? Heart attack Paternal Grandmother   ? Heart disease Paternal Grandmother   ? Alcohol abuse Paternal Grandfather   ? ? ?Social History  ? ?Socioeconomic History  ? Marital status: Significant Other  ?  Spouse name: Not on file  ? Number of children: 0  ? Years of education: Not on  file  ? Highest education level: Not on file  ?Occupational History  ? Occupation: med lab  ?Tobacco Use  ? Smoking status: Never  ? Smokeless tobacco: Never  ?Vaping Use  ? Vaping Use: Never used  ?Substance and Sexual Activity  ? Alcohol use: Not Currently  ?  Comment: prior to prgegnancy 4 drinks 1 week  ? Drug use: Never  ? Sexual activity: Yes  ?Other Topics Concern  ? Not on file  ?Social History Narrative  ? From [place]. Was down here for college, recently returned after being back in [place] for a bit.  ? ?Social Determinants of Health  ? ?Financial Resource Strain: Not on file  ?Food Insecurity: Not on file  ?Transportation Needs: Not on file  ?Physical Activity: Not on file  ?Stress: Not on file  ?Social Connections: Not on file  ?Intimate Partner Violence: Not on file  ? ? ?Outpatient Medications Prior to Visit  ?Medication Sig Dispense Refill  ? norethindrone (MICRONOR) 0.35 MG tablet Take 1 tablet by mouth daily.    ? omeprazole (PRILOSEC) 20 MG capsule  Take 1 capsule (20 mg total) by mouth daily. 30 capsule 3  ? acetaminophen (TYLENOL) 325 MG tablet Take 2 tablets (650 mg total) by mouth every 6 (six) hours as needed (for pain scale < 4). (Patient not taking: Reported on 09/13/2021)    ? ibuprofen (ADVIL) 200 MG tablet Take 3 tablets (600 mg total) by mouth every 6 (six) hours as needed. (Patient not taking: Reported on 09/13/2021)    ? Prenatal Vit-Fe Fumarate-FA (PRENATAL MULTIVITAMIN) TABS tablet Take 1 tablet by mouth daily at 12 noon. (Patient not taking: Reported on 11/12/2021)    ? ?No facility-administered medications prior to visit.  ? ? ?Allergies  ?Allergen Reactions  ? Banana Anaphylaxis  ? Penicillins Hives, Itching, Nausea And Vomiting, Rash and Shortness Of Breath  ? ? ?ROS ?Review of Systems  ?Constitutional: Negative.   ?HENT: Negative.    ?Eyes: Negative.   ?Respiratory: Negative.    ?Cardiovascular: Negative.   ?Gastrointestinal:  Positive for anal bleeding, nausea and vomiting. Negative for abdominal distention, abdominal pain, blood in stool, constipation, diarrhea and rectal pain.  ?Endocrine: Negative.   ?Genitourinary: Negative.   ?Musculoskeletal: Negative.   ?Skin: Negative.   ?Allergic/Immunologic: Negative.   ?Neurological: Negative.   ?Hematological: Negative.   ?Psychiatric/Behavioral: Negative.    ? ?  ?Objective:  ?  ?Physical Exam ?Vitals and nursing note reviewed.  ?Constitutional:   ?   General: She is not in acute distress. ?   Appearance: Normal appearance. She is normal weight. She is not ill-appearing, toxic-appearing or diaphoretic.  ?Cardiovascular:  ?   Rate and Rhythm: Normal rate and regular rhythm.  ?   Heart sounds: Normal heart sounds. No murmur heard. ?  No friction rub. No gallop.  ?Pulmonary:  ?   Effort: Pulmonary effort is normal. No respiratory distress.  ?   Breath sounds: Normal breath sounds. No stridor. No wheezing, rhonchi or rales.  ?Chest:  ?   Chest wall: No tenderness.  ?Skin: ?   General: Skin is warm and  dry.  ?Neurological:  ?   General: No focal deficit present.  ?   Mental Status: She is alert and oriented to person, place, and time. Mental status is at baseline.  ?Psychiatric:     ?   Mood and Affect: Mood normal.     ?   Behavior: Behavior normal.     ?  Thought Content: Thought content normal.     ?   Judgment: Judgment normal.  ? ? ?BP 99/62   Pulse 88   Temp 98 ?F (36.7 ?C) (Temporal)   Resp 18   Ht 5' 7"  (1.702 m)   Wt 152 lb (68.9 kg)   SpO2 100%   BMI 23.81 kg/m?  ?Wt Readings from Last 3 Encounters:  ?11/12/21 152 lb (68.9 kg)  ?09/13/21 180 lb (81.6 kg)  ?05/26/21 180 lb (81.6 kg)  ? ? ? ?Health Maintenance Due  ?Topic Date Due  ? COVID-19 Vaccine (1) Never done  ? HPV VACCINES (1 - 2-dose series) Never done  ? Hepatitis C Screening  Never done  ? ? ?   ?Topic Date Due  ? HPV VACCINES (1 - 2-dose series) Never done  ? ? ?No results found for: TSH ?Lab Results  ?Component Value Date  ? WBC 12.6 (H) 05/26/2021  ? HGB 12.6 05/26/2021  ? HCT 36.7 05/26/2021  ? MCV 93.6 05/26/2021  ? PLT 207 05/26/2021  ? ?Lab Results  ?Component Value Date  ? NA 137 05/26/2021  ? K 3.6 05/26/2021  ? CO2 20 (L) 05/26/2021  ? GLUCOSE 83 05/26/2021  ? BUN 8 05/26/2021  ? CREATININE 0.70 05/26/2021  ? BILITOT 1.3 (H) 05/26/2021  ? ALKPHOS 149 (H) 05/26/2021  ? AST 18 05/26/2021  ? ALT 11 05/26/2021  ? PROT 6.4 (L) 05/26/2021  ? ALBUMIN 3.1 (L) 05/26/2021  ? CALCIUM 9.3 05/26/2021  ? ANIONGAP 12 05/26/2021  ? ?No results found for: CHOL ?No results found for: HDL ?No results found for: [place] ?No results found for: TRIG ?No results found for: CHOLHDL ?No results found for: HGBA1C ? ?  ?Assessment & Plan:  ? ?Problem List Items Addressed This Visit   ?None ?Visit Diagnoses   ? ? Bilious vomiting with nausea    -  Primary  ? Relevant Medications  ? ondansetron (ZOFRAN-ODT) 4 MG disintegrating tablet  ? Other Relevant Orders  ? TSH  ? Comprehensive metabolic panel  ? CBC with Differential/Platelet  ? T4, free  ? H Pylori,  IGM, IGG, IGA AB  ? Epigastric pain      ? Relevant Orders  ? Comprehensive metabolic panel  ? CBC with Differential/Platelet  ? H Pylori, IGM, IGG, IGA AB  ? Chronic idiopathic constipation      ? Relevan

## 2021-11-12 NOTE — Addendum Note (Signed)
Addended by: Maximiano Coss on: 11/12/2021 01:24 PM ? ? Modules accepted: Orders ? ?

## 2021-11-12 NOTE — Addendum Note (Signed)
Addended by: Patrcia Dolly on: 11/12/2021 01:52 PM ? ? Modules accepted: Orders ? ?

## 2021-11-12 NOTE — Addendum Note (Signed)
Addended by: Maximiano Coss on: 11/12/2021 02:07 PM ? ? Modules accepted: Orders ? ?

## 2021-11-12 NOTE — Patient Instructions (Addendum)
Diana Yang -  ? ?Great to see you, sorry it's been a rough day.  ? ?Ondansetron sublingual three times daily as needed. Can resume omeprazole when this is tolerable. Hydrate well. Recommend some MiraLax to see if getting bowels moving will help ? ?I'll be in touch with labs ? ?Thank you ? ?Rich  ? ? ? ?If you have lab work done today you will be contacted with your lab results within the next 2 weeks.  If you have not heard from [place] then please contact us. The fastest way to get your results is to register for My Chart. ? ? ?IF you received an x-ray today, you will receive an invoice from [facility]. Please contact [facility] at [phone] with questions or concerns regarding your invoice.  ? ?IF you received labwork today, you will receive an invoice from [facility]. Please contact [facility] at [phone] with questions or concerns regarding your invoice.  ? ?Our billing staff will not be able to assist you with questions regarding bills from these companies. ? ?You will be contacted with the lab results as soon as they are available. The fastest way to get your results is to activate your My Chart account. Instructions are located on the last page of this paperwork. If you have not heard from [place] regarding the results in 2 weeks, please contact this office. ?  ? ? ?

## 2021-11-13 ENCOUNTER — Telehealth: Payer: Self-pay

## 2021-11-13 ENCOUNTER — Other Ambulatory Visit: Payer: Self-pay | Admitting: Registered Nurse

## 2021-11-13 DIAGNOSIS — R1114 Bilious vomiting: Secondary | ICD-10-CM

## 2021-11-13 NOTE — Telephone Encounter (Signed)
Patient called in asking about lab results. Wanting to know if someone can give her a call back.  ?

## 2021-11-13 NOTE — Telephone Encounter (Signed)
Left a message requesting the patient call back with the exact questions she has regarding the lab test results. ?

## 2021-11-14 ENCOUNTER — Other Ambulatory Visit: Payer: Self-pay | Admitting: Registered Nurse

## 2021-11-14 DIAGNOSIS — B9620 Unspecified Escherichia coli [E. coli] as the cause of diseases classified elsewhere: Secondary | ICD-10-CM

## 2021-11-14 LAB — URINE CULTURE
MICRO NUMBER:: 13156451
SPECIMEN QUALITY:: ADEQUATE

## 2021-11-14 MED ORDER — SULFAMETHOXAZOLE-TRIMETHOPRIM 800-160 MG PO TABS: 1.0000 | ORAL_TABLET | Freq: Two times a day (BID) | ORAL | 0 refills | Status: DC

## 2021-11-14 NOTE — Telephone Encounter (Signed)
Communicated via my chart, patient states she spoke to someone yesterday and didn't need anything at this time ?

## 2021-11-14 NOTE — Telephone Encounter (Signed)
Attempted to reach out via my chart as well ?

## 2021-11-16 ENCOUNTER — Other Ambulatory Visit: Payer: Self-pay

## 2021-11-16 ENCOUNTER — Ambulatory Visit (INDEPENDENT_AMBULATORY_CARE_PROVIDER_SITE_OTHER)
Admission: RE | Admit: 2021-11-16 | Discharge: 2021-11-16 | Disposition: A | Discharge status: Home / Self Care | Payer: 59 | Source: Ambulatory Visit | Attending: Registered Nurse | Admitting: Registered Nurse

## 2021-11-16 DIAGNOSIS — R1114 Bilious vomiting: Secondary | ICD-10-CM | POA: Diagnosis not present

## 2021-11-16 MED ORDER — IOHEXOL 300 MG/ML  SOLN
100.0000 mL | Freq: Once | INTRAMUSCULAR | Status: AC | PRN
  Administered 2021-11-16: 100 mL via INTRAVENOUS

## 2021-11-19 ENCOUNTER — Encounter: Payer: Self-pay | Admitting: Registered Nurse

## 2021-11-19 NOTE — Telephone Encounter (Signed)
Patient is calling in asking if someone is able to give her a call back as she has further questions about her labs/results.  ?

## 2021-11-20 ENCOUNTER — Other Ambulatory Visit: Payer: Self-pay | Admitting: Registered Nurse

## 2021-11-20 DIAGNOSIS — R1114 Bilious vomiting: Secondary | ICD-10-CM

## 2021-11-20 DIAGNOSIS — K5904 Chronic idiopathic constipation: Secondary | ICD-10-CM

## 2021-11-20 DIAGNOSIS — R1013 Epigastric pain: Secondary | ICD-10-CM

## 2021-11-20 NOTE — Telephone Encounter (Signed)
Patient states she would like to know what's the next steps after ruling some thing out in labs and she is still having some discomfort and feeling like she is having some contracrtions ?

## 2021-11-21 ENCOUNTER — Other Ambulatory Visit (INDEPENDENT_AMBULATORY_CARE_PROVIDER_SITE_OTHER): Payer: 59

## 2021-11-21 ENCOUNTER — Other Ambulatory Visit: Payer: Self-pay | Admitting: Registered Nurse

## 2021-11-21 ENCOUNTER — Other Ambulatory Visit: Payer: 59

## 2021-11-21 DIAGNOSIS — K5904 Chronic idiopathic constipation: Secondary | ICD-10-CM | POA: Diagnosis not present

## 2021-11-21 DIAGNOSIS — R1013 Epigastric pain: Secondary | ICD-10-CM

## 2021-11-21 DIAGNOSIS — R1114 Bilious vomiting: Secondary | ICD-10-CM | POA: Diagnosis not present

## 2021-11-21 LAB — CBC WITH DIFFERENTIAL/PLATELET
Basophils Absolute: 0.1 10*3/uL (ref 0.0–0.1)
Basophils Relative: 1.3 % (ref 0.0–3.0)
Eosinophils Absolute: 0.4 10*3/uL (ref 0.0–0.7)
Eosinophils Relative: 6.4 % — ABNORMAL HIGH (ref 0.0–5.0)
HCT: 40.2 % (ref 36.0–46.0)
Hemoglobin: 14 g/dL (ref 12.0–15.0)
Lymphocytes Relative: 40.2 % (ref 12.0–46.0)
Lymphs Abs: 2.4 10*3/uL (ref 0.7–4.0)
MCHC: 34.8 g/dL (ref 30.0–36.0)
MCV: 86 fl (ref 78.0–100.0)
Monocytes Absolute: 0.5 10*3/uL (ref 0.1–1.0)
Monocytes Relative: 8.2 % (ref 3.0–12.0)
Neutro Abs: 2.6 10*3/uL (ref 1.4–7.7)
Neutrophils Relative %: 43.9 % (ref 43.0–77.0)
Platelets: 271 10*3/uL (ref 150.0–400.0)
RBC: 4.67 Mil/uL (ref 3.87–5.11)
RDW: 13.1 % (ref 11.5–15.5)
WBC: 5.9 10*3/uL (ref 4.0–10.5)

## 2021-11-21 LAB — URINALYSIS, ROUTINE W REFLEX MICROSCOPIC
Bilirubin Urine: NEGATIVE
Hgb urine dipstick: NEGATIVE
Ketones, ur: NEGATIVE
Leukocytes,Ua: NEGATIVE
Nitrite: NEGATIVE
Specific Gravity, Urine: 1.015 (ref 1.000–1.030)
Total Protein, Urine: NEGATIVE
Urine Glucose: NEGATIVE
Urobilinogen, UA: 0.2 (ref 0.0–1.0)
pH: 6.5 (ref 5.0–8.0)

## 2021-11-21 LAB — COMPREHENSIVE METABOLIC PANEL
ALT: 10 U/L (ref 0–35)
AST: 11 U/L (ref 0–37)
Albumin: 4.8 g/dL (ref 3.5–5.2)
Alkaline Phosphatase: 58 U/L (ref 39–117)
BUN: 16 mg/dL (ref 6–23)
CO2: 28 mEq/L (ref 19–32)
Calcium: 9.6 mg/dL (ref 8.4–10.5)
Chloride: 99 mEq/L (ref 96–112)
Creatinine, Ser: 0.83 mg/dL (ref 0.40–1.20)
GFR: 98.21 mL/min (ref >60.00)
Glucose, Bld: 104 mg/dL — ABNORMAL HIGH (ref 70–99)
Potassium: 3.8 mEq/L (ref 3.5–5.1)
Sodium: 135 mEq/L (ref 135–145)
Total Bilirubin: 2 mg/dL — ABNORMAL HIGH (ref 0.2–1.2)
Total Protein: 7.2 g/dL (ref 6.0–8.3)

## 2021-11-23 ENCOUNTER — Other Ambulatory Visit: Payer: Self-pay | Admitting: Registered Nurse

## 2021-11-23 ENCOUNTER — Encounter: Payer: Self-pay | Admitting: Registered Nurse

## 2021-11-23 ENCOUNTER — Telehealth: Payer: Self-pay | Admitting: Registered Nurse

## 2021-11-23 DIAGNOSIS — R1013 Epigastric pain: Secondary | ICD-10-CM

## 2021-11-23 DIAGNOSIS — R42 Dizziness and giddiness: Secondary | ICD-10-CM

## 2021-11-23 DIAGNOSIS — R1114 Bilious vomiting: Secondary | ICD-10-CM

## 2021-11-23 LAB — ANA: Anti Nuclear Antibody (ANA): POSITIVE — AB

## 2021-11-23 LAB — CMV ABS, IGG+IGM (CYTOMEGALOVIRUS)
CMV IgM: <30 AU/mL
Cytomegalovirus Ab-IgG: <0.6 U/mL

## 2021-11-23 LAB — EPSTEIN-BARR VIRUS NUCLEAR ANTIGEN ANTIBODY, IGG: EBV NA IgG: >600 U/mL — ABNORMAL HIGH

## 2021-11-23 LAB — IGA: Immunoglobulin A: 107 mg/dL (ref 47–310)

## 2021-11-23 LAB — TISSUE TRANSGLUTAMINASE ABS,IGG,IGA
(tTG) Ab, IgA: <1 U/mL
(tTG) Ab, IgG: <1 U/mL

## 2021-11-23 LAB — ANTI-NUCLEAR AB-TITER (ANA TITER): ANA Titer 1: 1:80 {titer} — ABNORMAL HIGH

## 2021-11-23 NOTE — Telephone Encounter (Signed)
Have discussed this with patient ? ?Thanks, ? ?Rich

## 2021-11-23 NOTE — Telephone Encounter (Signed)
Patient called and she wanted to discuss her lab results in detail. 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 She is still having abdominal pain and thinks it's something else. She would like to speak with the provider.  ?

## 2021-11-24 ENCOUNTER — Other Ambulatory Visit: Payer: Self-pay | Admitting: Registered Nurse

## 2021-11-24 DIAGNOSIS — R768 Other specified abnormal immunological findings in serum: Secondary | ICD-10-CM

## 2021-11-26 ENCOUNTER — Encounter: Payer: Self-pay | Admitting: Gastroenterology

## 2021-12-07 ENCOUNTER — Ambulatory Visit: Payer: 59 | Admitting: Gastroenterology

## 2021-12-07 ENCOUNTER — Encounter: Payer: Self-pay | Admitting: Gastroenterology

## 2021-12-07 VITALS — BP 90/64 | HR 68 | Ht 66.75 in | Wt 154.1 lb

## 2021-12-07 DIAGNOSIS — R1010 Upper abdominal pain, unspecified: Secondary | ICD-10-CM

## 2021-12-07 DIAGNOSIS — R1110 Vomiting, unspecified: Secondary | ICD-10-CM

## 2021-12-07 NOTE — Patient Instructions (Signed)
If you are age 25 or younger, your body mass index should be between 19-25. Your Body mass index is 24.32 kg/m?Marland Kitchen If this is out of the aformentioned range listed, please consider follow up with your Primary Care Provider.  ?________________________________________________________ ? ?The [facility] providers would like to encourage you to use [facility] to communicate with providers for non-urgent requests or questions.  Due to long hold times on the telephone, sending your provider a message by [facility] may be a faster and more efficient way to get a response.  Please allow 48 business hours for a response.  Please remember that this is for non-urgent requests.  ?_______________________________________________________ ? ?You have been scheduled for a HIDA scan at [facility] (1st floor) on 12-19-21. Please arrive 30 minutes prior to your scheduled appointment at 6:06TK. Make certain not to have anything to eat or drink at least 6 hours prior to your test. Should this appointment date or time not work well for you, please call radiology scheduling at [phone].  ?_____________________________________________________________________ ?Hepatobiliary (HIDA) scan is an imaging procedure used to diagnose problems in the liver, gallbladder and bile ducts. In the HIDA scan, a radioactive chemical or tracer is injected into a vein in your arm. The tracer is handled by the liver like bile. Bile is a fluid produced and excreted by your liver that helps your digestive system break down fats in the foods you eat. Bile is stored in your gallbladder and the gallbladder releases the bile when you eat a meal. A special nuclear medicine scanner (gamma camera) tracks the flow of the tracer from your liver into your gallbladder and small intestine.  ?12-19-21 ?During your HIDA scan  ?You'll be asked to change into a hospital gown before your HIDA scan begins. Your health care team will position you on a table, usually on your  back. The radioactive tracer is then injected into a vein in your arm.The tracer travels through your bloodstream to your liver, where it's taken up by the bile-producing cells. The radioactive tracer travels with the bile from your liver into your gallbladder and through your bile ducts to your small intestine.You may feel some pressure while the radioactive tracer is injected into your vein. As you lie on the table, a special gamma camera is positioned over your abdomen taking pictures of the tracer as it moves through your body. The gamma camera takes pictures continually for about an hour. You'll need to keep still during the HIDA scan. This can become uncomfortable, but you may find that you can lessen the discomfort by taking deep breaths and thinking about other things. Tell your health care team if you're uncomfortable. The radiologist will watch on a computer the progress of the radioactive tracer through your body. The HIDA scan may be stopped when the radioactive tracer is seen in the gallbladder and enters your small intestine. This typically takes about an hour. In some cases extra imaging will be performed if original images aren't satisfactory, if morphine is given to help visualize the gallbladder or if the medication CCK is given to look at the contraction of the gallbladder. This test typically takes 2 hours to complete. ?________________________________________________________________________ ? ?Due to recent changes in healthcare laws, you may see the results of your imaging and laboratory studies on MyChart before your provider has had a chance to review them.  We understand that in some cases there may be results that are confusing or concerning to you. Not all laboratory results come  back in the same time frame and the provider may be waiting for multiple results in order to interpret others.  Please give [place] 48 hours in order for your provider to thoroughly review all the results before  contacting the office for clarification of your results.  ? ?Thank you for entrusting me with your care and choosing [facility]. ? ?Alonza Bogus, PA-C ?

## 2021-12-07 NOTE — Progress Notes (Signed)
? ? ? ?12/07/2021 ?Diana Yang ?[id number] ?[birth year] ? ? ?HISTORY OF PRESENT ILLNESS: This is a pleasant 26 year old female who is new to our office.  She has been referred to our office by her PCP, Maximiano Coss, NP, for evaluation of epigastric abdominal pain and vomiting.  She tells me that all of this started in January.  She said that she has been having upper abdominal pain that radiates to her back.  She says that in March she had an episode of pain that was very excruciating and woke her up from sleep and made her vomit.  In between these episodes she feels fine.  She has omeprazole that she has been using as needed, but she denies any overt heartburn or reflux symptoms.  She reports occasional constipation and occasional diarrhea, but nothing extreme and these issues are not new or necessarily associated with these episodes of pain that she has been experiencing.  During these episodes she has experienced dizziness as well. ? ?CT scan of the abdomen and pelvis with contrast on 11/19/2021 showed the following: ? ?IMPRESSION: ?No bowel obstruction. Mildly thickened appearance of the appendix ?without inflammatory changes. An early acute appendicitis is less ?likely but not entirely excluded. Clinical correlation recommended. ? ?CMP was normal except for elevated bilirubin due to her Gilbert's disease.  CBC normal.  TSH and celiac labs are normal/negative.  Her PCP ordered a "bowel disorders cascade" lab that is still in process. ? ? ?Past Medical History:  ?Diagnosis Date  ? Acne   ? Asthma   ? Autoimmune disease ([place])   ? diagnostics not finished prior to pt moving to [place]  ? Cardiac arrhythmia   ? Chronic headaches   ? Complication of anesthesia   ? PTS MOTHER AND GRANDMOTHER SLOW TO WAKE UP  ? Family history of adverse reaction to anesthesia   ? Gilbert's syndrome   ? Per patient  ? Kidney stones   ? Liver disease   ? Medullary sponge kidney   ? ?Past Surgical History:  ?Procedure Laterality Date  ?  BREAST BIOPSY Right   ? 2019, 2021  ? TONSILLECTOMY AND ADENOIDECTOMY  2015  ? ? reports that she has never smoked. She has never used smokeless tobacco. She reports that she does not currently use alcohol. She reports that she does not use drugs. ?family history includes Alcohol abuse in her father and paternal grandfather; Alzheimer's disease in her maternal grandfather; Arthritis in her brother; Asthma in her mother; Breast cancer in her maternal grandmother and paternal grandmother; Cancer in her mother; Depression in her brother; Diabetes in her maternal grandfather; Drug abuse in her father; Early death in her father; Berenice Primas' disease in her father; Hearing loss in her maternal grandmother; Heart attack in her paternal grandmother; Heart disease in her paternal grandmother; High Cholesterol in her maternal grandfather; High blood pressure in her maternal grandmother; Jaundice in her daughter; Kidney disease in her mother; Liver cancer in her maternal grandfather; Lung cancer in her maternal grandmother; Multiple myeloma in her maternal grandmother; Osteoporosis in her maternal grandmother; Post-traumatic stress disorder in her brother; Stroke in her father; Sudden Cardiac Death in her mother; Thyroid disease in her father. ?Allergies  ?Allergen Reactions  ? Banana Anaphylaxis  ? Penicillins Hives, Itching, Nausea And Vomiting, Rash and Shortness Of Breath  ? ? ?  ?Outpatient Encounter Medications as of 12/07/2021  ?Medication Sig  ? benzoyl peroxide (CERAVE ACNE FOAMING CREAM) 4 % external liquid Apply 1 application. topically  2 (two) times daily.  ? norethindrone (MICRONOR) 0.35 MG tablet Take 1 tablet by mouth daily.  ? spironolactone (ALDACTONE) 100 MG tablet Take 100 mg by mouth daily.  ? tretinoin (RETIN-A) 0.025 % cream Apply 1 application. topically every other day.  ? omeprazole (PRILOSEC) 20 MG capsule Take 1 capsule (20 mg total) by mouth daily. (Patient not taking: Reported on 12/07/2021)  ?  [DISCONTINUED] ondansetron (ZOFRAN-ODT) 4 MG disintegrating tablet Take 1 tablet (4 mg total) by mouth every 8 (eight) hours as needed for nausea or vomiting.  ? [DISCONTINUED] sulfamethoxazole-trimethoprim (BACTRIM DS) 800-160 MG tablet Take 1 tablet by mouth 2 (two) times daily.  ? ?No facility-administered encounter medications on file as of 12/07/2021.  ? ? ?REVIEW OF SYSTEMS  : All other systems reviewed and negative except where noted in the History of Present Illness. ? ? ?PHYSICAL EXAM: ?BP 90/64 (BP Location: Left Arm, Patient Position: Sitting, Cuff Size: Normal)   Pulse 68   Ht 5' 6.75" (1.695 m) Comment: measured without shoes  Wt 154 lb 2 oz (69.9 kg)   LMP 11/06/2021   Breastfeeding Yes   BMI 24.32 kg/m?  ?General: Well developed white female in no acute distress ?Head: Normocephalic and atraumatic ?Eyes:  Sclerae anicteric, conjunctiva pink. ?Ears: Normal auditory acuity ?Lungs: Clear throughout to auscultation; no W/R/R. ?Heart: Regular rate and rhythm; no M/R/G. ?Abdomen: Soft, non-distended.  BS present.  Non-tender. ?Musculoskeletal: Symmetrical with no gross deformities  ?Skin: No lesions on visible extremities ?Extremities: No edema  ?Neurological: Alert oriented x 4, grossly non-focal ?Psychological:  Alert and cooperative. Normal mood and affect ? ?ASSESSMENT AND PLAN: ?*Epigastric and right upper quadrant abdominal pain since January, worsening recently with episodes of vomiting.  CT scan is okay.  Symptoms sound most consistent with biliary colic than anything else.  We will plan for HIDA scan with CCK to rule out biliary dysfunction.  If that is negative then I guess we plan for EGD, but symptoms do not sound consistent with ulcer, esophagitis, etc, and she denies overt heartburn and reflux symptoms. ? ? ?CC:  Maximiano Coss, NP ? ?  ?

## 2021-12-09 ENCOUNTER — Other Ambulatory Visit: Payer: Self-pay | Admitting: Registered Nurse

## 2021-12-09 DIAGNOSIS — R1013 Epigastric pain: Secondary | ICD-10-CM

## 2021-12-09 NOTE — Progress Notes (Signed)
? ?Office Visit Note ? ?Patient: Diana Yang             ?Date of Birth: Dec 07, 1995           ?MRN: [id number]             ?PCP: Maximiano Coss, NP ?Referring: Maximiano Coss, NP ?Visit Date: 12/10/2021 ? ? ?Subjective:  ? ?History of Present Illness: Diana Yang is a 26 y.o. female here for follow up for chronic fatigue and skin rashes low positive ANA and dsDNA Abs. She has new G symptoms with new onset of severe abdominal pain on 3/19 woke her from sleep around 2 am lasted about 4 hours total. Pain was severe could not tolerate lying on either side and then had vomiting and diaphoresis. Symptoms did not recur but she kept having nausea and vomiting after meals lasting about 2 weeks. She had abdominal CT scan mostly nonspecific, she saw GI clinic galbladder disease suspected and was scheduled for HIDA scan on 4/26. She continues having similar rashes and skin discoloration on her legs as before. No particular exacerbation of any past inflammatory symptoms during this time. Lab testing redemonstrated positive ANA test. ? ?Previous HPI ?03/02/21 ?Diana Yang is a 26 y.o. 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 She was previously evaluated she believes around age 50 for hospitalization with some kinds of symptoms without specific diagnosis at the time and not on any chronic treatments she feels symptoms became more noticeable after going to college particularly with skin rashes and discoloration of the lower extremities as well as some of the fatigue and generalized aches.  She describes her skin changes as a weblike appearance that will come and go on the chest.  She had more chronic discoloration of her legs but notes this has pretty much cleared up during her pregnancy.  She also describes episodes of chest tightness and palpitations that last for a few minutes at a time.   She seen cardiology for work-up of this with overall unremarkable echocardiogram and EKG.  She has had some question whether this is related to her chronic anxiety though she does not describe very discrete panic attacks.  Workup in 2020 in the setting of seizures, arthralgias, alopecia, chest tightness, and lower extremity discoloration revealed borderline positive double-stranded DNA antibodies and she was started on HCQ 229m daily for possible early autoimmune disease process. ?  ?She also has history of fibroadenoma of the right breast. She also experiences fatigue, headaches, concentration difficulty treated with topiramate for migraines. ?  ?  ?Labs reviewed ?10/2021 ?ANA 1:80 speckled ? ?04/2019 ?ANA 1:80 speckled ?  ?06/2018 ?ANA neg ?dsDNA 10 ?Scl-70, centromere, Jo-1, Smith, SSA, SSB neg ?ACA, B2GP1, DRVVT neg ?  ?Imaging reviewed (report only) ?05/2019 CXR negative ?05/2019 TTE LVEF 45-50%, left ventricular function is slightly reduced ? ? ?Review of Systems  ?Constitutional:  Positive for fatigue.  ?HENT:  Negative for mouth sores, mouth dryness and nose dryness.   ?Eyes:  Negative for pain, itching and dryness.  ?Respiratory:  Negative for shortness of breath and difficulty breathing.   ?Cardiovascular:  Negative for chest pain and palpitations.  ?Gastrointestinal:  Positive for constipation and diarrhea. Negative for blood in stool.  ?Endocrine: Negative for increased urination.  ?Genitourinary:  Negative for difficulty urinating.  ?Musculoskeletal:  Negative for joint pain, joint pain, joint swelling, myalgias, morning stiffness, muscle  tenderness and myalgias.  ?Skin:  Negative for color change, rash and redness.  ?Allergic/Immunologic: Negative for susceptible to infections.  ?Neurological:  Negative for dizziness, numbness, headaches, memory loss and weakness.  ?Hematological:  Positive for bruising/bleeding tendency.  ?Psychiatric/Behavioral:  Negative for confusion.   ? ?PMFS History:  ?Patient  Active Problem List  ? Diagnosis Date Noted  ? Abdominal pain 12/10/2021  ? SVD (spontaneous vaginal delivery) 05/26/2021  ? Fibroadenoma 03/02/2021  ? 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 SLE (systemic lupus erythematosus) ([place]) 12/22/2020  ? 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  ?Past Medical History:  ?Diagnosis Date  ? Acne   ? Asthma   ? Autoimmune disease ([place])   ? diagnostics not finished prior to pt moving to [place]  ? Cardiac arrhythmia   ? Chronic headaches   ? Complication of anesthesia   ? PTS MOTHER AND GRANDMOTHER SLOW TO WAKE UP  ? Family history of adverse reaction to anesthesia   ? Gilbert's syndrome   ? Per patient  ? Kidney stones   ? Liver disease   ? Medullary sponge kidney   ?  ?Family History  ?Problem Relation Age of Onset  ? Sudden Cardiac Death Mother   ? Kidney disease Mother   ? Asthma Mother   ? Cancer Mother   ?     appendix  ? Alcohol abuse Father   ? Drug abuse Father   ? Early death Father   ?     76  ? Stroke Father   ? Graves' disease Father   ? Thyroid disease Father   ?     Thyroid storm  ? Arthritis Brother   ?     OA - from [place]  ? Depression Brother   ? Post-traumatic stress disorder Brother   ?     Marines  ? Breast cancer Maternal Grandmother   ? Lung cancer Maternal Grandmother   ?     smoker - but "not from smoking"  ? Multiple myeloma Maternal Grandmother   ?     "rare blood cancer"  ? High blood pressure Maternal Grandmother   ? Hearing loss Maternal Grandmother   ? Osteoporosis Maternal Grandmother   ? Liver cancer Maternal Grandfather   ? Diabetes Maternal Grandfather   ? High Cholesterol Maternal Grandfather   ? Alzheimer's disease Maternal Grandfather   ? Breast cancer Paternal Grandmother   ? Heart attack Paternal Grandmother   ? Heart disease Paternal Grandmother   ? Alcohol abuse Paternal Grandfather   ? Jaundice Daughter   ? ?Past Surgical History:  ?Procedure Laterality Date  ? BREAST BIOPSY Right   ? 2019, 2021   ? TONSILLECTOMY AND ADENOIDECTOMY  2015  ? ?Social History  ? ?Social History Narrative  ? From [place]. Was down here for college, recently returned after being back in [place] for a bit.  ? ?There is no immunization history for the selected administration types on file for this patient.  ? ?Objective: ?Vital Signs: BP 101/67 (BP Location: Left Arm, Patient Position: Sitting, Cuff Size: Normal)   Pulse 73   Ht 5' 7"  (1.702 m)   Wt 151 lb 12.8 oz (68.9 kg)   Breastfeeding Yes   BMI 23.78 kg/m?   ? ?Physical Exam ?HENT:  ?   Mouth/Throat:  ?   Mouth: Mucous membranes are moist.  ?   Pharynx: Oropharynx is clear.  ?Eyes:  ?  Conjunctiva/sclera: Conjunctivae normal.  ?Cardiovascular:  ?   Rate and Rhythm: Normal rate and regular rhythm.  ?Pulmonary:  ?   Effort: Pulmonary effort is normal.  ?   Breath sounds: Normal breath sounds.  ?Abdominal:  ?   General: Abdomen is flat. Bowel sounds are normal. There is no distension.  ?   Palpations: There is no mass.  ?   Tenderness: There is no abdominal tenderness. There is no guarding.  ?Musculoskeletal:  ?   Right lower leg: No edema.  ?   Left lower leg: No edema.  ?Skin: ?   General: Skin is warm and dry.  ?   Comments: Central facial erythema and acneiform rash ?Dry skin and mild erythema overlying dorsal side of MCPs  ?Neurological:  ?   Mental Status: She is alert.  ?Psychiatric:     ?   Mood and Affect: Mood normal.  ?  ? ?Musculoskeletal Exam:  ?Shoulders full ROM no tenderness or swelling ?Elbows full ROM no tenderness or swelling ?Wrists full ROM no tenderness or swelling ?Fingers full ROM no tenderness or swelling ?Knees full ROM no tenderness or swelling ?Ankles full ROM no tenderness or swelling ? ? ?Investigation: ?No additional findings. ? ?Imaging: ?CT Abdomen Pelvis W Contrast ? ?Result Date: 11/19/2021 ?CLINICAL DATA:  Nausea vomiting abdominal pain. EXAM: CT ABDOMEN AND PELVIS WITH CONTRAST TECHNIQUE: Multidetector CT imaging of the abdomen and pelvis  was performed using the standard protocol following bolus administration of intravenous contrast. RADIATION DOSE REDUCTION: This exam was performed according to the departmental dose-optimization program whic

## 2021-12-10 ENCOUNTER — Ambulatory Visit: Payer: 59 | Admitting: Internal Medicine

## 2021-12-10 ENCOUNTER — Encounter: Payer: Self-pay | Admitting: Internal Medicine

## 2021-12-10 VITALS — BP 101/67 | HR 73 | Ht 67.0 in | Wt 151.8 lb

## 2021-12-10 DIAGNOSIS — R109 Unspecified abdominal pain: Secondary | ICD-10-CM

## 2021-12-10 DIAGNOSIS — M329 Systemic lupus erythematosus, unspecified: Secondary | ICD-10-CM | POA: Diagnosis not present

## 2021-12-10 DIAGNOSIS — R1010 Upper abdominal pain, unspecified: Secondary | ICD-10-CM | POA: Insufficient documentation

## 2021-12-11 ENCOUNTER — Encounter: Payer: Self-pay | Admitting: Gastroenterology

## 2021-12-11 DIAGNOSIS — R1110 Vomiting, unspecified: Secondary | ICD-10-CM | POA: Insufficient documentation

## 2021-12-13 NOTE — Progress Notes (Signed)
____________________________________________________________ ? ?Attending physician addendum: ? ?Thank you for sending this case to me. ?I have reviewed the entire note and agree with the plan. ? ?It is not clear exactly what "bowel disorder" testing is pending for this patient.  I agree with the radiographic and possible subsequent endoscopic work-up you outlined. ? ?Wilfrid Lund, MD ? ?____________________________________________________________ ? ?

## 2021-12-17 ENCOUNTER — Telehealth: Payer: Self-pay | Admitting: Gastroenterology

## 2021-12-17 NOTE — Telephone Encounter (Signed)
The pt called to make sure the HIDA scan has been authorized by her insurance.  I see that it says authorized and covered benefit on the referral.  I have advised the pt and gave her the number to the schedulers if she has any further concerns  ?

## 2021-12-17 NOTE — Telephone Encounter (Signed)
Inbound call from patient stating that she would like to discuss test that she is scheduled for. Please advise.  ?

## 2021-12-19 ENCOUNTER — Ambulatory Visit (HOSPITAL_COMMUNITY)
Admission: RE | Admit: 2021-12-19 | Discharge: 2021-12-19 | Disposition: A | Discharge status: Home / Self Care | Payer: 59 | Source: Ambulatory Visit | Attending: Gastroenterology | Admitting: Gastroenterology

## 2021-12-19 DIAGNOSIS — R1010 Upper abdominal pain, unspecified: Secondary | ICD-10-CM | POA: Diagnosis present

## 2021-12-19 DIAGNOSIS — R1110 Vomiting, unspecified: Secondary | ICD-10-CM | POA: Diagnosis present

## 2021-12-19 MED ORDER — TECHNETIUM TC 99M MEBROFENIN IV KIT
7.4000 | PACK | Freq: Once | INTRAVENOUS | Status: AC | PRN
  Administered 2021-12-19: 7.4 via INTRAVENOUS

## 2021-12-20 ENCOUNTER — Other Ambulatory Visit: Payer: Self-pay

## 2021-12-20 DIAGNOSIS — R1110 Vomiting, unspecified: Secondary | ICD-10-CM

## 2021-12-20 DIAGNOSIS — R1010 Upper abdominal pain, unspecified: Secondary | ICD-10-CM

## 2022-01-11 ENCOUNTER — Other Ambulatory Visit: Payer: Self-pay | Admitting: Surgery

## 2022-01-11 DIAGNOSIS — R1013 Epigastric pain: Secondary | ICD-10-CM

## 2022-01-15 ENCOUNTER — Ambulatory Visit
Admission: RE | Admit: 2022-01-15 | Discharge: 2022-01-15 | Disposition: A | Discharge status: Home / Self Care | Payer: 59 | Source: Ambulatory Visit | Attending: Surgery | Admitting: Surgery

## 2022-01-15 DIAGNOSIS — R1013 Epigastric pain: Secondary | ICD-10-CM

## 2022-01-22 ENCOUNTER — Encounter: Payer: 59 | Admitting: Gastroenterology

## 2022-02-04 ENCOUNTER — Ambulatory Visit
Admission: RE | Admit: 2022-02-04 | Discharge: 2022-02-04 | Disposition: A | Discharge status: Home / Self Care | Payer: 59 | Source: Ambulatory Visit | Attending: Registered Nurse | Admitting: Registered Nurse

## 2022-02-04 DIAGNOSIS — Z9889 Other specified postprocedural states: Secondary | ICD-10-CM

## 2022-02-04 DIAGNOSIS — Z09 Encounter for follow-up examination after completed treatment for conditions other than malignant neoplasm: Secondary | ICD-10-CM

## 2022-06-01 IMAGING — US US BREAST*R* LIMITED INC AXILLA
1 series · 13 of 19 positions shown · non-contrast
Comparison: Previous exam(s).

CLINICAL DATA: Patient presents for evaluation of palpable
abnormality within the right breast 1 o'clock and 6 o'clock
position. Patient had prior right breast biopsy demonstrating a
fibroadenoma within the right breast 6 o'clock position.

EXAM:
ULTRASOUND OF THE RIGHT BREAST

[Series 1: us breast*right* limited inc axilla · 0.05mm/px · 13 of 19 slices shown]
[im 1/19]
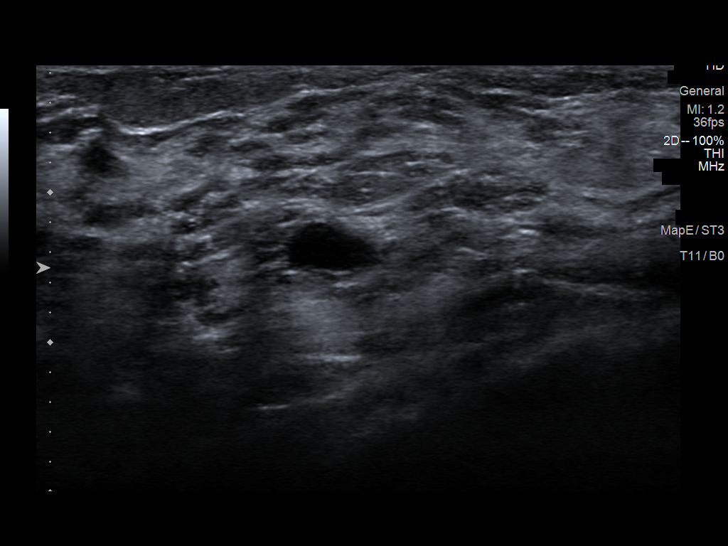
[im 3/19]
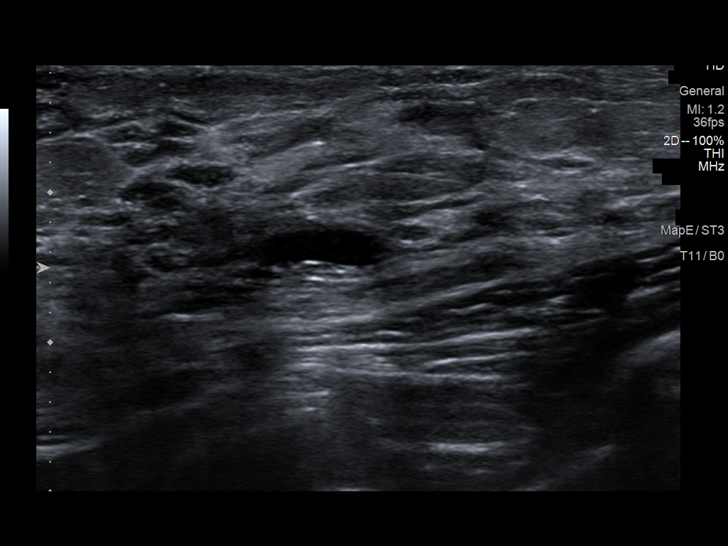
[im 4/19]
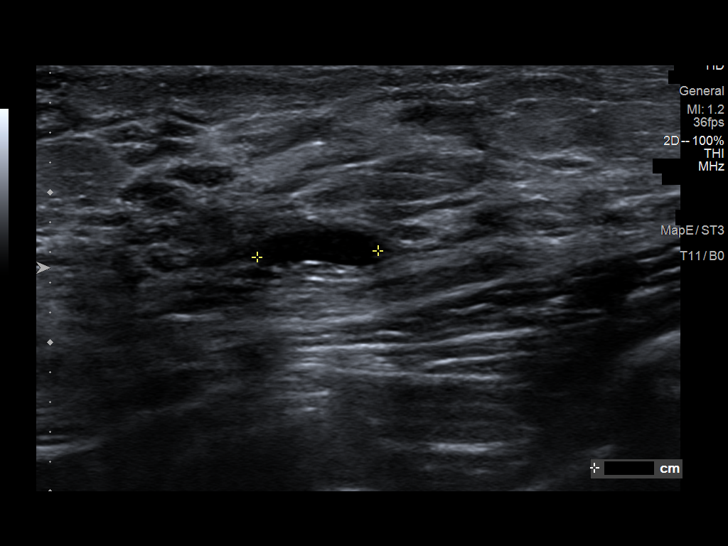
[im 6/19]
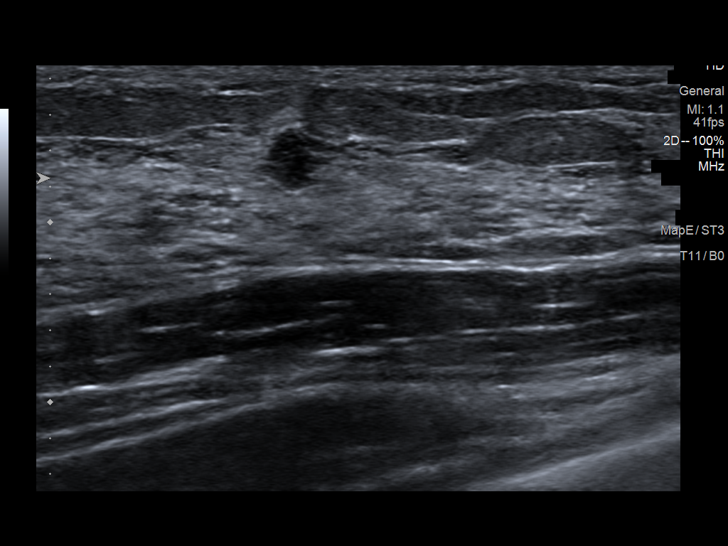
[im 7/19]
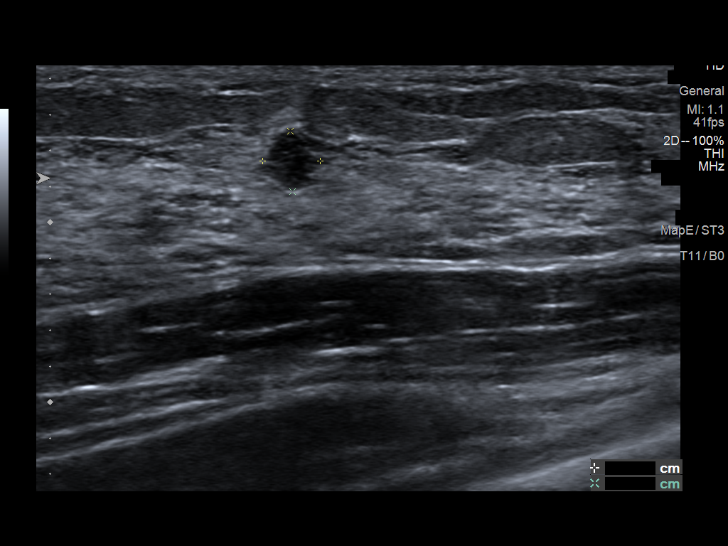
[im 9/19]
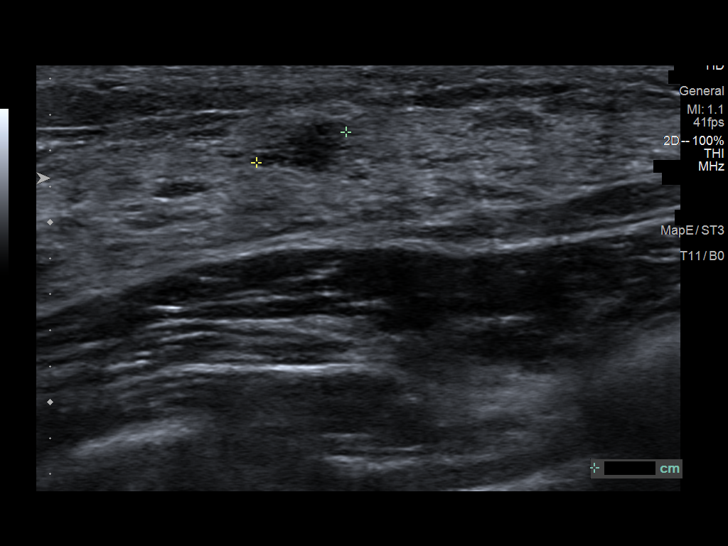
[im 10/19]
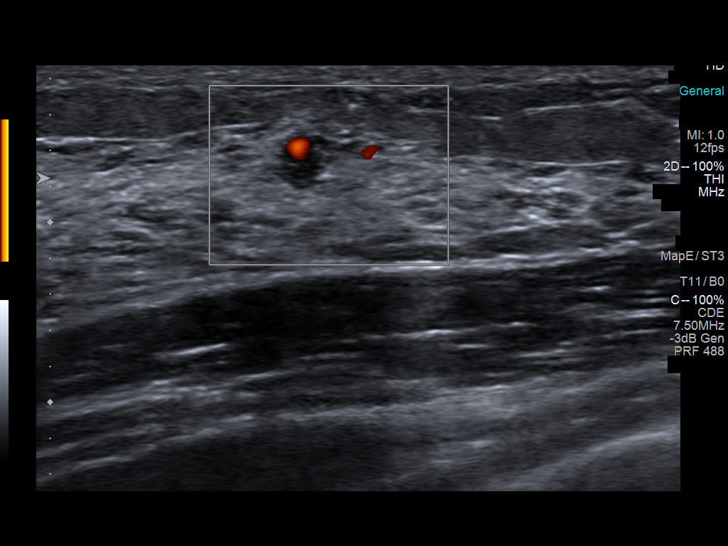
[im 11/19]
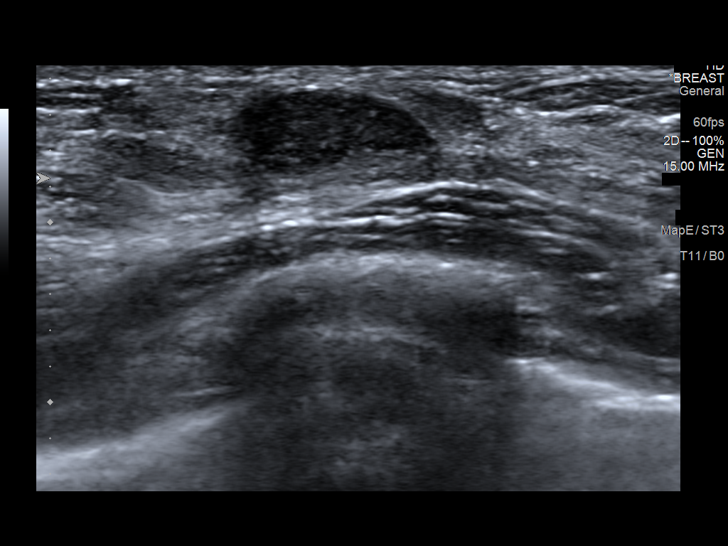
[im 13/19]
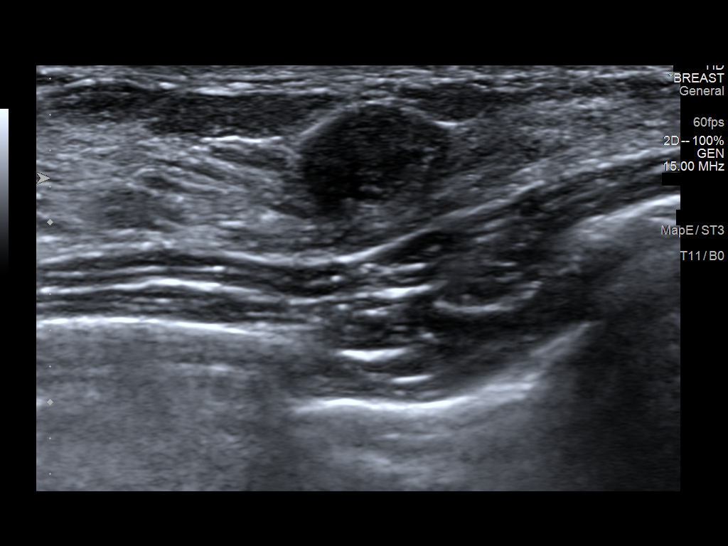
[im 14/19]
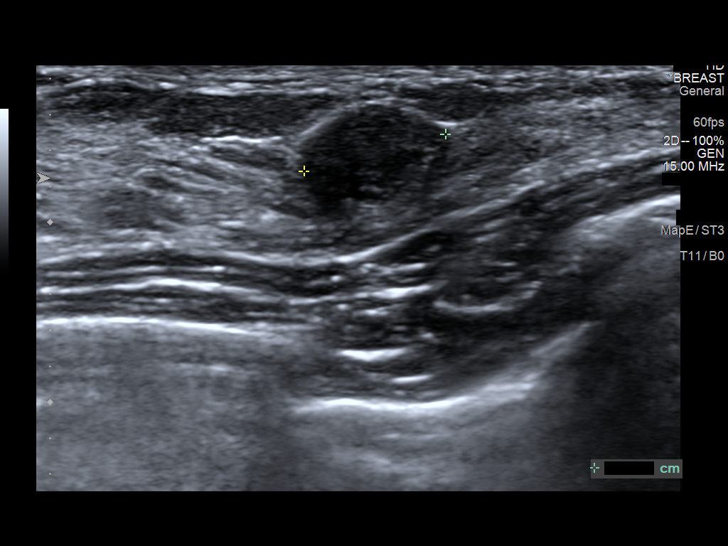
[im 16/19]
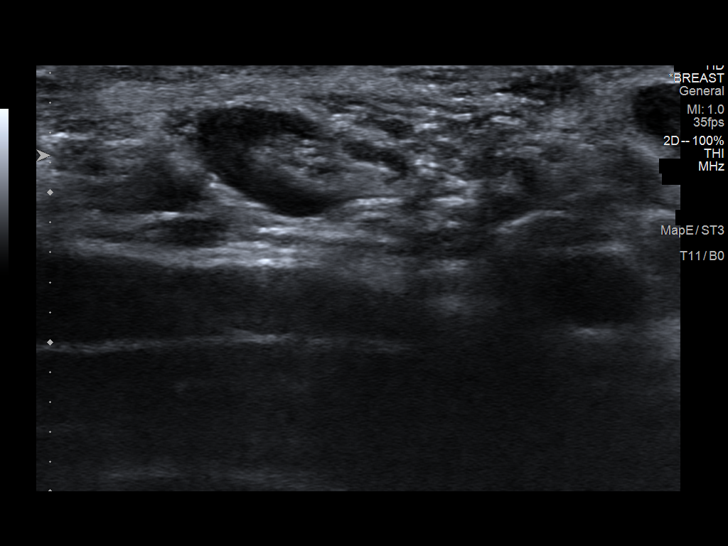
[im 17/19]
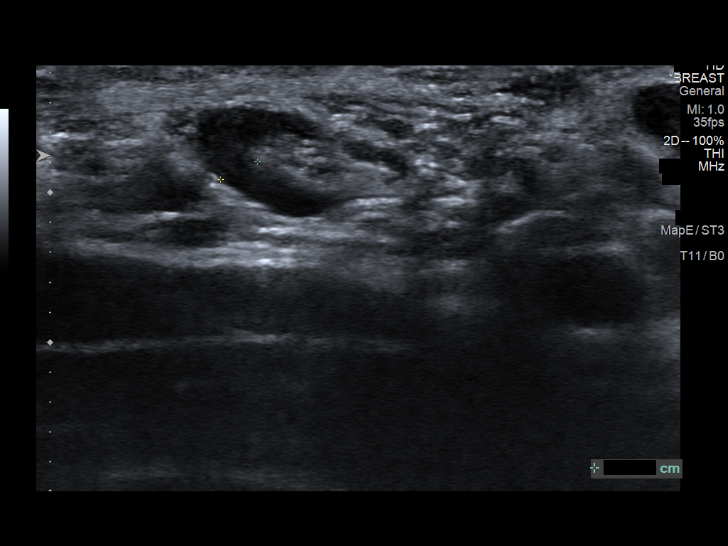
[im 19/19]
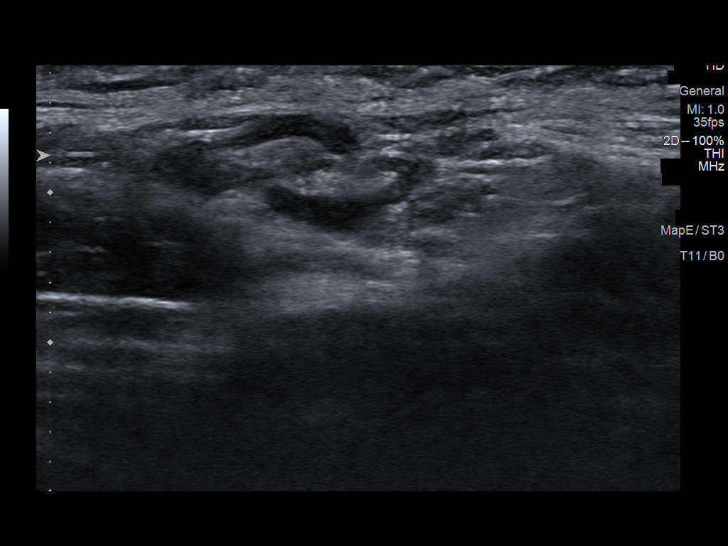

[13 of 19 positions shown; findings below may reference images not displayed]

FINDINGS: On physical exam, there is a palpable mass within the inferior right
breast. Dense tissue is palpated superiorly within the right breast.

Targeted ultrasound is performed, showing a similar-appearing oval
circumscribed mass right breast 6 o'clock position 5 cm from nipple
measuring 8 x 11 x 4 mm. This was reported as biopsied previously at
an outside institution with pathology demonstrating fibroadenoma.

Within the right breast 1 o'clock position 2 cm from nipple there is
a 5 x 3 x 8 mm cyst.

Within the right breast 1 o'clock position 6 cm from nipple there is
a 3 x 3 x 5 mm irregular hypoechoic mass.

No right axillary adenopathy.
IMPRESSION: Indeterminate hypoechoic mass right breast 1 o'clock position 6 cm
from nipple.

Previously biopsied mass right breast 6 o'clock position with
outside radiology report stating that this was a fibroadenoma per
biopsy.

RECOMMENDATION:
Ultrasound-guided core needle biopsy indeterminate right breast mass
1 o'clock position.

I have discussed the findings and recommendations with the patient.
If applicable, a reminder letter will be sent to the patient
regarding the next appointment.

BI-RADS CATEGORY  4: Suspicious.

## 2022-07-23 ENCOUNTER — Encounter: Payer: Self-pay | Admitting: Family

## 2022-07-23 ENCOUNTER — Ambulatory Visit: Payer: 59 | Admitting: Family

## 2022-07-23 ENCOUNTER — Other Ambulatory Visit: Payer: Self-pay | Admitting: Family

## 2022-07-23 VITALS — BP 118/60 | HR 97 | Temp 98.6°F | Ht 67.0 in | Wt 157.4 lb

## 2022-07-23 DIAGNOSIS — J209 Acute bronchitis, unspecified: Secondary | ICD-10-CM

## 2022-07-23 DIAGNOSIS — R051 Acute cough: Secondary | ICD-10-CM

## 2022-07-23 MED ORDER — PREDNISONE 20 MG PO TABS
40.0000 mg | ORAL_TABLET | Freq: Every day | ORAL | 0 refills | Status: DC
Start: 2022-07-23 — End: 2023-03-25

## 2022-07-23 MED ORDER — DOXYCYCLINE HYCLATE 100 MG PO TABS: 100.0000 mg | ORAL_TABLET | Freq: Two times a day (BID) | ORAL | 0 refills | Status: DC

## 2022-07-23 NOTE — Progress Notes (Signed)
Acute Office Visit  Subjective:     Patient ID: Diana Yang, female    DOB: Sep 17, 1995, 26 y.o.   MRN: [id number]  Chief Complaint  Patient presents with  . Cough    Pt states last Monday it started out with a bad sore throat with cough and congestion and headache with nausea and vomiting, pt had a fever for 2 days but the fever broke pt has had body aches and green mucus     HPI Patient is in today with c/o cough, chest congestion and sore throat x 1.5 weeks but worsening over the last 2 days to include a low grade fever. She now has body aches and green mucous. Has a child who is in daycare who has been ill but is better now. Has been taking OTC medication w/o much relief.   Review of Systems  Constitutional:  Positive for chills and fever.  HENT: Negative.    Respiratory:  Positive for cough and sputum production.   Cardiovascular: Negative.   Gastrointestinal: Negative.   Musculoskeletal: Negative.   Skin: Negative.   Neurological: Negative.   Endo/Heme/Allergies: Negative.   Psychiatric/Behavioral: Negative.    All other systems reviewed and are negative.      Objective:    BP 118/60   Pulse 97   Temp 98.6 F (37 C)   Ht '5\' 7"'$  (1.702 m)   Wt 157 lb 6.4 oz (71.4 kg)   SpO2 99%   BMI 24.65 kg/m    Physical Exam Constitutional:      Appearance: Normal appearance. She is normal weight.  HENT:     Right Ear: Tympanic membrane, ear canal and external ear normal.     Left Ear: Tympanic membrane, ear canal and external ear normal.     Mouth/Throat:     Mouth: Mucous membranes are moist.  Cardiovascular:     Rate and Rhythm: Normal rate and regular rhythm.     Pulses: Normal pulses.     Heart sounds: Normal heart sounds.  Pulmonary:     Effort: Pulmonary effort is normal.     Breath sounds: Normal breath sounds.  Musculoskeletal:        General: Normal range of motion.     Cervical back: Normal range of motion and neck supple.  Skin:    General: Skin  is warm and dry.  Neurological:     General: No focal deficit present.     Mental Status: She is alert and oriented to person, place, and time.  Psychiatric:        Mood and Affect: Mood normal.        Behavior: Behavior normal.   No results found for any visits on 07/23/22.      Assessment & Plan:   Problem List Items Addressed This Visit   None Visit Diagnoses     Acute bronchitis, unspecified organism    -  Primary   Acute cough           Meds ordered this encounter  Medications  . predniSONE (DELTASONE) 20 MG tablet    Sig: Take 2 tablets (40 mg total) by mouth daily with breakfast.    Dispense:  10 tablet    Refill:  0  . doxycycline (VIBRA-TABS) 100 MG tablet    Sig: Take 1 tablet (100 mg total) by mouth 2 (two) times daily.    Dispense:  20 tablet    Refill:  0   Call the office  if symptoms worsen or persist. Recheck as scheduled and sooner as needed.   No follow-ups on file.  Kennyth Arnold, FNP

## 2022-07-24 ENCOUNTER — Other Ambulatory Visit: Payer: Self-pay | Admitting: Family

## 2022-07-24 ENCOUNTER — Telehealth: Payer: Self-pay

## 2022-07-24 MED ORDER — ALBUTEROL SULFATE HFA 108 (90 BASE) MCG/ACT IN AERS: 2.0000 | INHALATION_SPRAY | Freq: Four times a day (QID) | RESPIRATORY_TRACT | 0 refills | Status: DC | PRN

## 2022-07-24 NOTE — Telephone Encounter (Signed)
Patient called stating the pharmacy would not dispense steroid due to not being able to take it with a medication that she is on and they are asking for an alternative. Please advise   Sick visit 07/22/22

## 2022-07-25 NOTE — Telephone Encounter (Signed)
Called patient, No Answer, LM informing her alternative has been sent

## 2022-08-13 ENCOUNTER — Encounter: Payer: Self-pay | Admitting: Family

## 2022-08-13 ENCOUNTER — Ambulatory Visit: Payer: 59 | Admitting: Family

## 2022-08-13 VITALS — BP 100/60 | HR 86 | Temp 98.5°F | Ht 67.0 in | Wt 155.4 lb

## 2022-08-13 DIAGNOSIS — R059 Cough, unspecified: Secondary | ICD-10-CM

## 2022-08-13 DIAGNOSIS — J069 Acute upper respiratory infection, unspecified: Secondary | ICD-10-CM | POA: Diagnosis not present

## 2022-08-13 MED ORDER — CETIRIZINE-PSEUDOEPHEDRINE ER 5-120 MG PO TB12: 1.0000 | ORAL_TABLET | Freq: Two times a day (BID) | ORAL | 0 refills | Status: DC

## 2022-08-13 NOTE — Progress Notes (Unsigned)
Acute Office Visit  Subjective:     Patient ID: Diana Yang, female    DOB: 1996-01-27, 26 y.o.   MRN: [id number]  Chief Complaint  Patient presents with  . Generalized Body Aches    Pt has body aches, chills, headache, nausea, pt has had a fever off and on pt states it has been going on since thanksgiving, pt got a little better from the antibiotic but it came back     HPI Patient is in today with c/o body aches, headache, fever, and congestion that has come and go over the last month. She was treated last month with Doxy and Albuterol with similar symptoms. She reports   Is not vaccinated against COVID and Flu. Has had Mono as a child.   Review of Systems  Constitutional:  Positive for chills, fever and malaise/fatigue.  HENT:  Positive for congestion.   Respiratory:  Positive for cough. Negative for shortness of breath and wheezing.   Cardiovascular: Negative.   Gastrointestinal: Negative.   Genitourinary: Negative.   Musculoskeletal:  Positive for myalgias.  Neurological: Negative.   Psychiatric/Behavioral: Negative.    Past Medical History:  Diagnosis Date  . Acne   . Asthma   . Autoimmune disease ([place])    diagnostics not finished prior to pt moving to [place]  . Cardiac arrhythmia   . Chronic headaches   . Complication of anesthesia    PTS MOTHER AND GRANDMOTHER SLOW TO WAKE UP  . Family history of adverse reaction to anesthesia   . Gilbert's syndrome    Per patient  . Kidney stones   . Liver disease   . Medullary sponge kidney     Social History   Socioeconomic History  . Marital status: Significant Other    Spouse name: Not on file  . Number of children: 1  . Years of education: Not on file  . Highest education level: Not on file  Occupational History  . Occupation: med lab  Tobacco Use  . Smoking status: Never    Passive exposure: Past  . Smokeless tobacco: Never  Vaping Use  . Vaping Use: Never used  Substance and Sexual Activity  . Alcohol  use: Not Currently    Comment: rarely  . Drug use: Never  . Sexual activity: Yes  Other Topics Concern  . Not on file  Social History Narrative   From [place]. Was down here for college, recently returned after being back in [place] for a bit.   Social Determinants of Health   Financial Resource Strain: Not on file  Food Insecurity: Not on file  Transportation Needs: Not on file  Physical Activity: Not on file  Stress: Not on file  Social Connections: Not on file  Intimate Partner Violence: Not on file    Past Surgical History:  Procedure Laterality Date  . BREAST BIOPSY Right    2019, 2021  . TONSILLECTOMY AND ADENOIDECTOMY  2015    Family History  Problem Relation Age of Onset  . Sudden Cardiac Death Mother   . Kidney disease Mother   . Asthma Mother   . Cancer Mother        appendix  . Alcohol abuse Father   . Drug abuse Father   . Early death Father        69  . Stroke Father   . Graves' disease Father   . Thyroid disease Father        Thyroid storm  . Arthritis  Brother        OA - from [employer]  . Depression Brother   . Post-traumatic stress disorder Brother        Marines  . Breast cancer Maternal Grandmother   . Lung cancer Maternal Grandmother        smoker - but "not from smoking"  . Multiple myeloma Maternal Grandmother        "rare blood cancer"  . High blood pressure Maternal Grandmother   . Hearing loss Maternal Grandmother   . Osteoporosis Maternal Grandmother   . Liver cancer Maternal Grandfather   . Diabetes Maternal Grandfather   . High Cholesterol Maternal Grandfather   . Alzheimer's disease Maternal Grandfather   . Breast cancer Paternal Grandmother   . Heart attack Paternal Grandmother   . Heart disease Paternal Grandmother   . Alcohol abuse Paternal Grandfather   . Jaundice Daughter     Allergies  Allergen Reactions  . Banana Anaphylaxis  . Penicillins Hives, Itching, Nausea And Vomiting, Rash and Shortness Of Breath  .  Penicillin G Other (See Comments)    Current Outpatient Medications on File Prior to Visit  Medication Sig Dispense Refill  . ISOtretinoin (ACCUTANE) 30 MG capsule Take 30 mg by mouth 2 (two) times daily.    Marland Kitchen tretinoin (RETIN-A) 0.05 % cream Apply topically.    Marland Kitchen albuterol (VENTOLIN HFA) 108 (90 Base) MCG/ACT inhaler Inhale 2 puffs into the lungs every 6 (six) hours as needed for wheezing or shortness of breath. (Patient not taking: Reported on 08/13/2022) 8 g 0  . doxycycline (VIBRA-TABS) 100 MG tablet Take 1 tablet (100 mg total) by mouth 2 (two) times daily. (Patient not taking: Reported on 08/13/2022) 20 tablet 0  . norethindrone (MICRONOR) 0.35 MG tablet Take 1 tablet by mouth daily. (Patient not taking: Reported on 08/13/2022)    . predniSONE (DELTASONE) 20 MG tablet Take 2 tablets (40 mg total) by mouth daily with breakfast. (Patient not taking: Reported on 08/13/2022) 10 tablet 0  . spironolactone (ALDACTONE) 50 MG tablet 1 tablet Orally Once a day for 30 days (Patient not taking: Reported on 08/13/2022)     No current facility-administered medications on file prior to visit.    BP 100/60   Pulse 86   Temp 98.5 F (36.9 C)   Ht _0  (1.702 m)   Wt 155 lb 6.4 oz (70.5 kg)   SpO2 97%   BMI 24.34 kg/m chart      Objective:    BP 100/60   Pulse 86   Temp 98.5 F (36.9 C)   Ht _1  (1.702 m)   Wt 155 lb 6.4 oz (70.5 kg)   SpO2 97%   BMI 24.34 kg/m    Physical Exam Vitals and nursing note reviewed.  Constitutional:      Appearance: Normal appearance.  HENT:     Right Ear: Tympanic membrane, ear canal and external ear normal.     Left Ear: Tympanic membrane, ear canal and external ear normal.     Mouth/Throat:     Mouth: Mucous membranes are moist.  Cardiovascular:     Rate and Rhythm: Tachycardia present. Rhythm irregular.     Pulses: Normal pulses.     Heart sounds: Normal heart sounds.  Pulmonary:     Effort: Pulmonary effort is normal.     Breath sounds:  Normal breath sounds.  Musculoskeletal:        General: Normal range of motion.  Cervical back: Normal range of motion and neck supple.  Skin:    General: Skin is warm and dry.  Neurological:     General: No focal deficit present.     Mental Status: She is alert and oriented to person, place, and time.  Psychiatric:        Mood and Affect: Mood normal.        Behavior: Behavior normal.   No results found for any visits on 08/13/22.      Assessment & Plan:   Problem List Items Addressed This Visit   None Visit Diagnoses     Upper respiratory infection, acute    -  Primary   Relevant Orders   CBC w/Diff   Comp Met (CMET)   Cough, unspecified type           Meds ordered this encounter  Medications  . cetirizine-pseudoephedrine (ZYRTEC-D ALLERGY & SINUS) 5-120 MG tablet    Sig: Take 1 tablet by mouth 2 (two) times daily.    Dispense:  20 tablet    Refill:  0    Will follow-up pending labs. Starts Zyrtec-D twice daily. Call the office if symptoms worsen or persist.   Kennyth Arnold, FNP

## 2022-08-14 LAB — COMPREHENSIVE METABOLIC PANEL
ALT: 32 U/L (ref 0–35)
AST: 17 U/L (ref 0–37)
Albumin: 4.3 g/dL (ref 3.5–5.2)
Alkaline Phosphatase: 55 U/L (ref 39–117)
BUN: 13 mg/dL (ref 6–23)
CO2: 27 mEq/L (ref 19–32)
Calcium: 9.2 mg/dL (ref 8.4–10.5)
Chloride: 102 mEq/L (ref 96–112)
Creatinine, Ser: 0.8 mg/dL (ref 0.40–1.20)
GFR: 102.13 mL/min (ref >60.00)
Glucose, Bld: 83 mg/dL (ref 70–99)
Potassium: 4.1 mEq/L (ref 3.5–5.1)
Sodium: 140 mEq/L (ref 135–145)
Total Bilirubin: 1.3 mg/dL — ABNORMAL HIGH (ref 0.2–1.2)
Total Protein: 7.3 g/dL (ref 6.0–8.3)

## 2022-08-14 LAB — CBC WITH DIFFERENTIAL/PLATELET
Basophils Absolute: 0 10*3/uL (ref 0.0–0.1)
Basophils Relative: 0.6 % (ref 0.0–3.0)
Eosinophils Absolute: 0.1 10*3/uL (ref 0.0–0.7)
Eosinophils Relative: 2 % (ref 0.0–5.0)
HCT: 39.9 % (ref 36.0–46.0)
Hemoglobin: 14.1 g/dL (ref 12.0–15.0)
Lymphocytes Relative: 36.5 % (ref 12.0–46.0)
Lymphs Abs: 1.8 10*3/uL (ref 0.7–4.0)
MCHC: 35.4 g/dL (ref 30.0–36.0)
MCV: 86.6 fl (ref 78.0–100.0)
Monocytes Absolute: 0.5 10*3/uL (ref 0.1–1.0)
Monocytes Relative: 10.2 % (ref 3.0–12.0)
Neutro Abs: 2.5 10*3/uL (ref 1.4–7.7)
Neutrophils Relative %: 50.7 % (ref 43.0–77.0)
Platelets: 226 10*3/uL (ref 150.0–400.0)
RBC: 4.61 Mil/uL (ref 3.87–5.11)
RDW: 13.3 % (ref 11.5–15.5)
WBC: 4.9 10*3/uL (ref 4.0–10.5)

## 2023-03-24 ENCOUNTER — Encounter: Payer: Self-pay | Admitting: Family Medicine

## 2023-03-25 ENCOUNTER — Encounter: Payer: Self-pay | Admitting: Family Medicine

## 2023-03-25 ENCOUNTER — Ambulatory Visit: Payer: 59 | Admitting: Family Medicine

## 2023-03-25 ENCOUNTER — Telehealth: Payer: Self-pay

## 2023-03-25 VITALS — BP 120/64 | HR 86 | Temp 98.7°F | Ht 67.0 in | Wt 160.0 lb

## 2023-03-25 DIAGNOSIS — Z7689 Persons encountering health services in other specified circumstances: Secondary | ICD-10-CM | POA: Diagnosis not present

## 2023-03-25 DIAGNOSIS — Z111 Encounter for screening for respiratory tuberculosis: Secondary | ICD-10-CM

## 2023-03-25 DIAGNOSIS — L730 Acne keloid: Secondary | ICD-10-CM | POA: Insufficient documentation

## 2023-03-25 DIAGNOSIS — R5383 Other fatigue: Secondary | ICD-10-CM

## 2023-03-25 DIAGNOSIS — Z1329 Encounter for screening for other suspected endocrine disorder: Secondary | ICD-10-CM

## 2023-03-25 LAB — CBC WITH DIFFERENTIAL/PLATELET
Basophils Absolute: 0.1 10*3/uL (ref 0.0–0.1)
Basophils Relative: 1.4 % (ref 0.0–3.0)
Eosinophils Absolute: 0.3 10*3/uL (ref 0.0–0.7)
Eosinophils Relative: 6 % — ABNORMAL HIGH (ref 0.0–5.0)
HCT: 40.1 % (ref 36.0–46.0)
Hemoglobin: 13.6 g/dL (ref 12.0–15.0)
Lymphocytes Relative: 38.3 % (ref 12.0–46.0)
Lymphs Abs: 1.9 10*3/uL (ref 0.7–4.0)
MCHC: 33.8 g/dL (ref 30.0–36.0)
MCV: 90.2 fl (ref 78.0–100.0)
Monocytes Absolute: 0.3 10*3/uL (ref 0.1–1.0)
Monocytes Relative: 6.9 % (ref 3.0–12.0)
Neutro Abs: 2.3 10*3/uL (ref 1.4–7.7)
Neutrophils Relative %: 47.4 % (ref 43.0–77.0)
Platelets: 218 10*3/uL (ref 150.0–400.0)
RBC: 4.45 Mil/uL (ref 3.87–5.11)
RDW: 12.8 % (ref 11.5–15.5)
WBC: 4.9 10*3/uL (ref 4.0–10.5)

## 2023-03-25 LAB — COMPREHENSIVE METABOLIC PANEL
ALT: 10 U/L (ref 0–35)
AST: 13 U/L (ref 0–37)
Albumin: 4.2 g/dL (ref 3.5–5.2)
Alkaline Phosphatase: 38 U/L — ABNORMAL LOW (ref 39–117)
BUN: 13 mg/dL (ref 6–23)
CO2: 28 mEq/L (ref 19–32)
Calcium: 9.2 mg/dL (ref 8.4–10.5)
Chloride: 106 mEq/L (ref 96–112)
Creatinine, Ser: 0.89 mg/dL (ref 0.40–1.20)
GFR: 89.48 mL/min (ref >60.00)
Glucose, Bld: 92 mg/dL (ref 70–99)
Potassium: 3.6 mEq/L (ref 3.5–5.1)
Sodium: 139 mEq/L (ref 135–145)
Total Bilirubin: 1.6 mg/dL — ABNORMAL HIGH (ref 0.2–1.2)
Total Protein: 6.2 g/dL (ref 6.0–8.3)

## 2023-03-25 LAB — VITAMIN D 25 HYDROXY (VIT D DEFICIENCY, FRACTURES): VITD: 22.35 ng/mL — ABNORMAL LOW (ref 30.00–100.00)

## 2023-03-25 LAB — VITAMIN B12: Vitamin B-12: 275 pg/mL (ref 211–911)

## 2023-03-25 LAB — TSH: TSH: 1.51 u[IU]/mL (ref 0.35–5.50)

## 2023-03-25 NOTE — Patient Instructions (Signed)
-  It was pleasure to meet you and look forward to taking care of you. -Ordered screening labs. Office will call with lab results and you may see them on MyChart.  -Will completed the immunization record for [facility] and will give you a call once it is completed. Will need to obtain last Tdap booster from GYN office.

## 2023-03-25 NOTE — Progress Notes (Signed)
New Patient Office Visit  Subjective    Patient ID: Diana Yang, female    DOB: 04-Oct-1995  Age: 27 y.o. MRN: [id number]  CC:  Chief Complaint  Patient presents with   Establish Care    Pt is here today to Est Care Pt is Fastin Pt reports no concerns today.    HPI Diana Yang presents to establish care with new provider.  Patients previous primary care provider was Janeece Agee, NP. Last appointment was 11/12/2021. Patient has seen other providers in the office since then.   Specialist: [facility], Dr. Carrington Clamp.   Patient is complaining of fatigue. She reports she goes through periods of time where she feels like she is not getting enough or over sleeping. She reports this month, she can go to sleep at 9pm and get up at 7am, still feel tired. She reports her partner reports she snores every once in a while. She does not take naps during the day.   Patient is starting [employer] for the fall semester. She is needing a TB screening and forms completed about vaccines. She has brought most of her vaccine records from her pediatrician office. Will look in [facility] vaccine records for Tdap booster. If not, had vaccine with gyn during pregnancy.   Outpatient Encounter Medications as of 03/25/2023  Medication Sig   Drospirenone-Estetrol (NEXTSTELLIS PO) Take 1 tablet by mouth daily.   [DISCONTINUED] albuterol (VENTOLIN HFA) 108 (90 Base) MCG/ACT inhaler Inhale 2 puffs into the lungs every 6 (six) hours as needed for wheezing or shortness of breath. (Patient not taking: Reported on 03/25/2023)   [DISCONTINUED] cetirizine-pseudoephedrine (ZYRTEC-D ALLERGY & SINUS) 5-120 MG tablet Take 1 tablet by mouth 2 (two) times daily. (Patient not taking: Reported on 03/25/2023)   [DISCONTINUED] doxycycline (VIBRA-TABS) 100 MG tablet Take 1 tablet (100 mg total) by mouth 2 (two) times daily. (Patient not taking: Reported on 08/13/2022)   [DISCONTINUED] ISOtretinoin (ACCUTANE) 30 MG capsule  Take 30 mg by mouth 2 (two) times daily. (Patient not taking: Reported on 03/25/2023)   [DISCONTINUED] norethindrone (MICRONOR) 0.35 MG tablet Take 1 tablet by mouth daily. (Patient not taking: Reported on 08/13/2022)   [DISCONTINUED] predniSONE (DELTASONE) 20 MG tablet Take 2 tablets (40 mg total) by mouth daily with breakfast. (Patient not taking: Reported on 08/13/2022)   [DISCONTINUED] spironolactone (ALDACTONE) 50 MG tablet 1 tablet Orally Once a day for 30 days (Patient not taking: Reported on 08/13/2022)   [DISCONTINUED] tretinoin (RETIN-A) 0.05 % cream Apply topically. (Patient not taking: Reported on 03/25/2023)   No facility-administered encounter medications on file as of 03/25/2023.    Past Medical History:  Diagnosis Date   Acne    Asthma    Autoimmune disease ([facility])    diagnostics not finished prior to pt moving to [place]   Chronic headaches    Complication of anesthesia    PTS MOTHER AND GRANDMOTHER SLOW TO WAKE UP   Family history of adverse reaction to anesthesia    Gilbert's syndrome    Per patient   Kidney stones    Liver disease    Medullary sponge kidney    PCOS (polycystic ovarian syndrome)     Past Surgical History:  Procedure Laterality Date   BREAST BIOPSY Right    2019, 2021   BREAST BIOPSY     08/2020-Right   TONSILLECTOMY AND ADENOIDECTOMY  2015    Family History  Problem Relation Age of Onset   Sudden Cardiac Death Mother  Kidney disease Mother    Asthma Mother    Cancer Mother        appendix   Alcohol abuse Father    Drug abuse Father    Early death Father        63   Stroke Father    Graves' disease Father    Thyroid disease Father        Thyroid storm   Arthritis Brother        OA - from [employer]   Depression Brother    Post-traumatic stress disorder Brother        Marines   Jaundice Daughter    Breast cancer Maternal Grandmother    Lung cancer Maternal Grandmother        smoker - but "not from smoking"   Multiple myeloma Maternal  Grandmother        "rare blood cancer"   High blood pressure Maternal Grandmother    Hearing loss Maternal Grandmother    Osteoporosis Maternal Grandmother    Liver cancer Maternal Grandfather    Diabetes Maternal Grandfather    High Cholesterol Maternal Grandfather    Alzheimer's disease Maternal Grandfather    Breast cancer Paternal Grandmother    Heart attack Paternal Grandmother    Heart disease Paternal Grandmother    Alcohol abuse Paternal Grandfather     Social History   Socioeconomic History   Marital status: Significant Other    Spouse name: Not on file   Number of children: 1   Years of education: Not on file   Highest education level: Bachelor's degree (e.g., BA, AB, BS)  Occupational History   Occupation: med lab  Tobacco Use   Smoking status: Never    Passive exposure: Past   Smokeless tobacco: Never  Vaping Use   Vaping status: Never Used  Substance and Sexual Activity   Alcohol use: Yes    Comment: 2x month 1-2 beers   Drug use: Never   Sexual activity: Yes  Other Topics Concern   Not on file  Social History Narrative   From [place]. Was down here for college, recently returned after being back in [place] for a bit.   Social Determinants of Health   Financial Resource Strain: Low Risk  (03/25/2023)   Overall Financial Resource Strain (CARDIA)    Difficulty of Paying Living Expenses: Not hard at all  Food Insecurity: No Food Insecurity (03/25/2023)   Hunger Vital Sign    Worried About Running Out of Food in the Last Year: Never true    Ran Out of Food in the Last Year: Never true  Transportation Needs: No Transportation Needs (03/25/2023)   PRAPARE - Administrator, Civil Service (Medical): No    Lack of Transportation (Non-Medical): No  Physical Activity: Sufficiently Active (03/25/2023)   Exercise Vital Sign    Days of Exercise per Week: 5 days    Minutes of Exercise per Session: 60 min  Stress: No Stress Concern Present (03/25/2023)    [employer] of Occupational Health - Occupational Stress Questionnaire    Feeling of Stress : Only a little  Social Connections: Socially Isolated (03/25/2023)   Social Connection and Isolation Panel [NHANES]    Frequency of Communication with Friends and Family: More than three times a week    Frequency of Social Gatherings with Friends and Family: Never    Attends Religious Services: Never    Database administrator or Organizations: No    Attends [employer]  or Organization Meetings: Never    Marital Status: Never married  Intimate Partner Violence: Not At Risk (03/25/2023)   Humiliation, Afraid, Rape, and Kick questionnaire    Fear of Current or Ex-Partner: No    Emotionally Abused: No    Physically Abused: No    Sexually Abused: No    ROS See HPI above    Objective   BP 120/64   Pulse 86   Temp 98.7 F (37.1 C)   Ht 5\' 7"  (1.702 m)   Wt 160 lb (72.6 kg)   SpO2 99%   BMI 25.06 kg/m   Physical Exam Vitals reviewed.  Constitutional:      General: She is not in acute distress.    Appearance: Normal appearance. She is not ill-appearing, toxic-appearing or diaphoretic.  HENT:     Head: Normocephalic and atraumatic.  Eyes:     General:        Right eye: No discharge.        Left eye: No discharge.     Conjunctiva/sclera: Conjunctivae normal.  Cardiovascular:     Rate and Rhythm: Normal rate and regular rhythm.     Heart sounds: Normal heart sounds. No murmur heard.    No friction rub. No gallop.  Pulmonary:     Effort: Pulmonary effort is normal. No respiratory distress.     Breath sounds: Normal breath sounds.  Musculoskeletal:        General: Normal range of motion.     Right lower leg: No edema.     Left lower leg: No edema.  Skin:    General: Skin is warm and dry.  Neurological:     General: No focal deficit present.     Mental Status: She is alert and oriented to person, place, and time. Mental status is at baseline.  Psychiatric:        Mood and Affect:  Mood normal.        Behavior: Behavior normal.        Thought Content: Thought content normal.        Judgment: Judgment normal.      Assessment & Plan:  Fatigue, unspecified type -     VITAMIN D 25 Hydroxy (Vit-D Deficiency, Fractures) -     Vitamin B12  Screening for endocrine disorder -     TSH -     CBC with Differential/Platelet -     Comprehensive metabolic panel  Screening-pulmonary TB -     QuantiFERON-TB Gold Plus  Encounter to establish care  1.Review health maintenance: -Hep C and HIV-request GYN, possibly had these test 2 years ago when pregnant.  -Covid vaccine/boosters-decline -Pap smears-will call to make an appointment with GYN, not due till 04/2023.  -HPV vaccine-had vaccines, will update chart.   -Tdap-completed at GYN, request records  2.Ordered screening labs and for reason of fatigue: TSH, CBC  w/ Diff, CMP, Vitamin D, and Vitamin B12. Also, ordered Quantiferon TB Gold Plus for screening with admission to [facility].  3.Will complete the immunization record for [facility] and give patient a call once it is completed. Will need to wait on TB results and obtain Tdap vaccine record from GYN office.  Return in about 1 year (around 03/24/2024) for physical.   Zandra Abts, NP

## 2023-03-25 NOTE — Telephone Encounter (Signed)
I have informed the pt of lab results and she had no concerns  at this time .

## 2023-03-25 NOTE — Telephone Encounter (Signed)
-----   Message from Zandra Abts sent at 03/25/2023 12:51 PM EDT ----- Your bilirubin is slightly elevated, less elevated than previously. You had an ultrasound last year and gallbladder and liver looked good. Recommend to continue to monitor for further elevation. Vitamin D is low, which can cause fatigue. Recommend over the counter Vitamin D3 5,000IU, 1 tablet a day. Recommend to recheck vitamin D in 3 months for vitamin D deficiency. All other labs are stable.

## 2023-03-28 ENCOUNTER — Telehealth: Payer: Self-pay

## 2023-03-28 NOTE — Telephone Encounter (Signed)
Spoke with [facility] they reports 03/07/2021 Tdap given

## 2023-03-28 NOTE — Telephone Encounter (Signed)
Pt will reports she is travling out of town and would like to pick this up Monday 03/31/2023 Placed in file cabinet up front

## 2023-03-28 NOTE — Telephone Encounter (Signed)
Pt returned phone call. Was told her call would be returned by the office.

## 2023-03-28 NOTE — Telephone Encounter (Signed)
-----   Message from Zandra Abts sent at 03/28/2023  9:47 AM EDT ----- Your Quantiferon TB is negative. I am just waiting on records from GYN about Tdap date of last given before your forms will be ready. You may want to call GYN to see if they have sent [place] a copy of your records.

## 2023-04-02 ENCOUNTER — Telehealth: Payer: Self-pay | Admitting: Family Medicine

## 2023-04-02 NOTE — Telephone Encounter (Signed)
Talked to patient . She just wanted to ask question about how many shots are given for MMR

## 2023-04-02 NOTE — Telephone Encounter (Signed)
Caller name: Diana Yang  On DPR?: Yes  Call back number: [phone] (mobile)  Provider they see: Alveria Apley, NP  Reason for call:  Pt called to speak to a nurse about some paperwork that was recently filled out. She asked for a return phone call

## 2023-04-07 ENCOUNTER — Ambulatory Visit: Payer: 59 | Admitting: Family Medicine

## 2023-04-07 ENCOUNTER — Encounter: Payer: Self-pay | Admitting: Neurology

## 2023-04-07 ENCOUNTER — Encounter: Payer: Self-pay | Admitting: Family Medicine

## 2023-04-07 VITALS — BP 108/58 | HR 104 | Temp 98.7°F | Ht 67.0 in | Wt 157.4 lb

## 2023-04-07 DIAGNOSIS — G43901 Migraine, unspecified, not intractable, with status migrainosus: Secondary | ICD-10-CM

## 2023-04-07 DIAGNOSIS — R112 Nausea with vomiting, unspecified: Secondary | ICD-10-CM

## 2023-04-07 LAB — POC COVID19 BINAXNOW: SARS Coronavirus 2 Ag: NEGATIVE

## 2023-04-07 MED ORDER — KETOROLAC TROMETHAMINE 60 MG/2ML IM SOLN
60.0000 mg | Freq: Once | INTRAMUSCULAR | Status: AC
  Administered 2023-04-07: 60 mg via INTRAMUSCULAR

## 2023-04-07 MED ORDER — ONDANSETRON 4 MG PO TBDP: 4.0000 mg | ORAL_TABLET | Freq: Three times a day (TID) | ORAL | 0 refills | Status: AC | PRN

## 2023-04-07 MED ORDER — METHYLPREDNISOLONE ACETATE 80 MG/ML IJ SUSP
80.0000 mg | Freq: Once | INTRAMUSCULAR | Status: AC
  Administered 2023-04-07: 80 mg via INTRAMUSCULAR

## 2023-04-07 NOTE — Progress Notes (Signed)
Acute Office Visit   Subjective:  Patient ID: Diana Yang, female    DOB: 1995-10-01, 27 y.o.   MRN: [id number]  Chief Complaint  Patient presents with   Headache    Pt reports headache last night OTC -Ibuprofen and Sudafed Pt reports this is a intense pain that radiated down her back. Pt reports she has to hold her head at times due to pain  Pt has HX of migraines in 7th grade thur HS Pt reports this feels different, then past.    HPI:  Patient is complaining of headache that started last around 5pm. She reports it is located and started in bilateral temples, behind eyes and radiates to occipital area. Constant pain, but intensity changes. When the intensity becomes severe, the pain will radiate down her spine. Constant throbbing pain; more severe sharp, intense pain. Associated symptoms of nausea, vomiting x 2, and dizziness when changing positions. Denies light sensitivity and noise sensitivity.    She reports she has a history of migraines that started in 7th grade through high school. She reports she was on Topiramate. She reports this was prescribed by her pediatrician. Denies seeing a neurologist. She reports she didn't like taking medication due to interfering with her mood/emotional health. She reports she was on one other medication, but does not recall the name of that medication was. She reports that medication she just stopped taking it.  She reports in the last month, she has had increase in her number of headaches, but not this severe. She reports on average she has had 1-2 headaches every week for the last month, lasting for a day or less. She has been able to take Ibuprofen and rest with relief. She reports this time, she took Ibuprofen and Sudafed. She was just covering her bases of thinking it could be allergy related. Denies any symptoms of allergies.   Review of Systems  Neurological:  Positive for headaches.   See HPI above     Objective:   BP (!) 108/58    Pulse (!) 104   Temp 98.7 F (37.1 C)   Ht 5\' 7"  (1.702 m)   Wt 157 lb 6 oz (71.4 kg)   SpO2 99%   BMI 24.65 kg/m    Physical Exam Vitals reviewed.  Constitutional:      General: She is not in acute distress.    Appearance: Normal appearance. She is not ill-appearing, toxic-appearing or diaphoretic.  HENT:     Head: Normocephalic and atraumatic.  Eyes:     General:        Right eye: No discharge.        Left eye: No discharge.     Extraocular Movements:     Right eye: No nystagmus.     Left eye: No nystagmus.     Conjunctiva/sclera: Conjunctivae normal.  Cardiovascular:     Rate and Rhythm: Normal rate and regular rhythm.     Heart sounds: Normal heart sounds. No murmur heard.    No friction rub. No gallop.  Pulmonary:     Effort: Pulmonary effort is normal. No respiratory distress.     Breath sounds: Normal breath sounds.  Musculoskeletal:        General: Normal range of motion.  Skin:    General: Skin is warm and dry.  Neurological:     General: No focal deficit present.     Mental Status: She is alert and oriented to person, place, and time. Mental status is  at baseline.     Cranial Nerves: Cranial nerves 2-12 are intact.     Motor: No weakness.     Gait: Gait normal.  Psychiatric:        Mood and Affect: Mood normal.        Behavior: Behavior normal.        Thought Content: Thought content normal.        Judgment: Judgment normal.       Assessment & Plan:  Nausea and vomiting, unspecified vomiting type -     Ondansetron; Take 1 tablet (4 mg total) by mouth every 8 (eight) hours as needed for nausea or vomiting.  Dispense: 20 tablet; Refill: 0  Migraine with status migrainosus, not intractable, unspecified migraine type -     POC COVID-19 BinaxNow -     Ketorolac Tromethamine -     methylPREDNISolone Acetate -     CT HEAD WO CONTRAST ( ); Future -     Ambulatory referral to Neurology  -Rapid covid is negative. Ordered test due to headache with this  being a sign of covid.  -Administered Toradol 60mg  IM injection and Depo-Medrol 80mg  IM injection for rapid headache/migraine relief. Advised to please call back to the office or send a MyChart message to let us know how you are doing tomorrow. If not improved, will prescribe additional medication. If it is after hours, advised to please go to the emergency department if headache becomes worse.  -Recommend to take Tylenol 1000mg  and Ibuprofen 800mg  together or separate alternating every 4 hours for headache if not relieved. Recommend to eat something if you take Ibuprofen.  -Recommend to stay hydrated. 64-100oz of fluid a day. Becare with changes in position.  -Prescribed Zofran 4mg  ODT every 8 hours as needed for nausea and vomiting.  -Ordered a CT head scan without contrast for a migraine and headaches becoming more severe and frequent.  -Placed a referral to neurology for migraine and headache becoming more severe and increase in frequent number of headaches.   Zandra Abts, NP

## 2023-04-07 NOTE — Patient Instructions (Addendum)
-  You have been given Toradol 60mg  IM injection and Depo-Medrol 80mg  IM injection for headache. Please call back to the office or send a MyChart message to let us know how you are doing tomorrow. If not improved, will prescribed additional medication. If it is after hours, please go to the emergency department if headache becomes worse.  -Recommend to take Tylenol 1000mg  and Ibuprofen 800mg  together or separate alternating every 4 hours for headache if not relieved. Recommend to eat something if you take Ibuprofen.  -Recommend to stay hydrated. 64-100oz of fluid a day. Becare with changes in position.  -Prescribed Zofran 4mg  ODT every 8 hours as needed for nausea and vomiting.  -Ordered a CT head scan without contrast for a migraine and headaches becoming more severe and frequent.  -Placed a referral to neurology for migraine and headache becoming more severe and increase in frequent number of headaches.

## 2023-04-08 ENCOUNTER — Telehealth: Payer: Self-pay

## 2023-04-08 ENCOUNTER — Other Ambulatory Visit: Payer: Self-pay

## 2023-04-08 ENCOUNTER — Telehealth: Payer: Self-pay | Admitting: Family Medicine

## 2023-04-08 ENCOUNTER — Encounter: Payer: Self-pay | Admitting: Family Medicine

## 2023-04-08 ENCOUNTER — Other Ambulatory Visit: Payer: Self-pay | Admitting: Family Medicine

## 2023-04-08 DIAGNOSIS — G43901 Migraine, unspecified, not intractable, with status migrainosus: Secondary | ICD-10-CM

## 2023-04-08 MED ORDER — UBRELVY 100 MG PO TABS: 100.0000 mg | ORAL_TABLET | Freq: Every day | ORAL | 0 refills | Status: DC

## 2023-04-08 MED ORDER — SUMATRIPTAN SUCCINATE 50 MG PO TABS: 50.0000 mg | ORAL_TABLET | ORAL | 0 refills | Status: DC | PRN

## 2023-04-08 MED ORDER — UBRELVY 100 MG PO TABS: ORAL_TABLET | ORAL | 0 refills | Status: DC

## 2023-04-08 NOTE — Telephone Encounter (Signed)
This is not covered by her insurance. She is calling her insurance to see what is covered.

## 2023-04-08 NOTE — Telephone Encounter (Signed)
Pt states she is still feeling as bad as she did yesterday, should she come back in or go to the ER?

## 2023-04-08 NOTE — Telephone Encounter (Signed)
error 

## 2023-04-08 NOTE — Telephone Encounter (Signed)
Pt called back and was informed of recommendation.

## 2023-04-08 NOTE — Progress Notes (Signed)
Sending in prescription to see if this helps with migraine since insurance reports they will cover this medication.

## 2023-04-08 NOTE — Progress Notes (Signed)
Patient was seen yesterday for headache and steroid/NSAID injection was not effective yesterday.

## 2023-04-08 NOTE — Telephone Encounter (Signed)
Pt called and states she is still having real bad pain in back of head . Like her head is going to explode . Hurts to move head eyes hurt  Denies any visual changes  Nausea   Vomiting last night she is asking she should come back in or go to the ER ?

## 2023-04-10 ENCOUNTER — Ambulatory Visit (INDEPENDENT_AMBULATORY_CARE_PROVIDER_SITE_OTHER): Payer: 59

## 2023-04-10 ENCOUNTER — Ambulatory Visit (HOSPITAL_BASED_OUTPATIENT_CLINIC_OR_DEPARTMENT_OTHER): Payer: 59

## 2023-04-10 DIAGNOSIS — G43901 Migraine, unspecified, not intractable, with status migrainosus: Secondary | ICD-10-CM | POA: Diagnosis not present

## 2023-04-11 ENCOUNTER — Other Ambulatory Visit: Payer: Self-pay

## 2023-04-11 ENCOUNTER — Telehealth: Payer: Self-pay

## 2023-04-11 DIAGNOSIS — G43901 Migraine, unspecified, not intractable, with status migrainosus: Secondary | ICD-10-CM

## 2023-04-11 MED ORDER — PREDNISONE 10 MG PO TABS: ORAL_TABLET | ORAL | 0 refills | Status: AC

## 2023-04-11 NOTE — Progress Notes (Signed)
Pt has been notified. Prednisone 10mg  sent in for 6day.

## 2023-04-11 NOTE — Telephone Encounter (Signed)
Placed new referral . Sent to [facility]

## 2023-05-05 ENCOUNTER — Other Ambulatory Visit: Payer: Self-pay | Admitting: Family Medicine

## 2023-05-05 DIAGNOSIS — G43901 Migraine, unspecified, not intractable, with status migrainosus: Secondary | ICD-10-CM

## 2023-05-13 ENCOUNTER — Encounter: Payer: Self-pay | Admitting: Neurology

## 2023-05-13 ENCOUNTER — Telehealth: Payer: Self-pay

## 2023-05-13 ENCOUNTER — Other Ambulatory Visit: Payer: Self-pay | Admitting: Neurology

## 2023-05-13 ENCOUNTER — Ambulatory Visit: Payer: 59 | Admitting: Neurology

## 2023-05-13 VITALS — BP 114/72 | HR 84 | Ht 67.0 in | Wt 157.0 lb

## 2023-05-13 DIAGNOSIS — G43E09 Chronic migraine with aura, not intractable, without status migrainosus: Secondary | ICD-10-CM | POA: Diagnosis not present

## 2023-05-13 MED ORDER — AIMOVIG 140 MG/ML ~~LOC~~ SOAJ: 140.0000 mg | SUBCUTANEOUS | 11 refills | Status: DC

## 2023-05-13 NOTE — Patient Instructions (Signed)
  Start Aimovig 140mg  injection every 28 days.   Take rizatriptan at earliest onset of headache.  May repeat dose once in 2 hours if needed.  Maximum 2 tablets in 24 hours.  Take Zofran for nausea Limit use of pain relievers to no more than 2 days out of the week.  These medications include acetaminophen, NSAIDs (ibuprofen/Advil/Motrin, naproxen/Aleve, triptans (Imitrex/sumatriptan), Excedrin, and narcotics.  This will help reduce risk of rebound headaches. Be aware of common food triggers Routine exercise Stay adequately hydrated (aim for 64 oz water daily) Keep headache diary Maintain proper stress management Maintain proper sleep hygiene Do not skip meals Consider supplements:  magnesium citrate 400mg  daily, riboflavin 400mg  daily, coenzyme Q10 300mg  daily
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
## 2023-05-13 NOTE — Telephone Encounter (Signed)
PA needed for Aimovig.

## 2023-05-15 ENCOUNTER — Other Ambulatory Visit (HOSPITAL_COMMUNITY): Payer: Self-pay

## 2023-05-15 ENCOUNTER — Telehealth: Payer: Self-pay | Admitting: Pharmacy Technician

## 2023-05-15 NOTE — Telephone Encounter (Signed)
PA has been submitted, and telephone encounter has been created.

## 2023-05-15 NOTE — Telephone Encounter (Signed)
Pharmacy Patient Advocate Encounter   Received notification from RX Request Messages that prior authorization for AIMOVIG 140MG  is required/requested.   Insurance verification completed.   The patient is insured through [facility] .   Per test claim: PA required; PA submitted to [facility] via CoverMyMeds Key/confirmation #/EOC [id number] Status is pending

## 2023-05-16 NOTE — Telephone Encounter (Signed)
Pharmacy Patient Advocate Encounter  Received notification from [facility] that Prior Authorization for AIMOVIG 140MG  has been DENIED.  See denial reason below. No denial letter attached in CMM. Will attache denial letter to Media tab once received.   PA #/Case ID/Reference #: [id number]

## 2023-05-26 MED ORDER — PROPRANOLOL HCL 60 MG PO TABS: 60.0000 mg | ORAL_TABLET | Freq: Every day | ORAL | 5 refills | Status: AC

## 2023-05-26 NOTE — Telephone Encounter (Signed)
Patient advised,  Per Dr. Everlena Cooper, Please let patient know that her insurance requires her to try one other medication prior to approving Aimovig.  If she is agreeable, we can start PROPRANOLOL ER 60MG  DAILY.  REFILL 5.  It is a blood pressure medication that is effective for treating migraines.  She will need to monitor for lightheadedness.  If she has side effects, contact us.  If she is tolerating medication but not yet effective, contact us in four weeks (prior to refill) and I will increase dose.   Patient agreed to the Propranolol 60 mg

## 2023-05-28 IMAGING — US US BREAST*R* LIMITED INC AXILLA
1 series · 7 of 7 positions shown · non-contrast
Comparison: Previous exam(s).

CLINICAL DATA: 25-year-old female presenting for delayed six-month
follow-up of a previously biopsied right breast mass demonstrating
fibroadenoma.

EXAM:
ULTRASOUND OF THE RIGHT BREAST

[Series 1: us breast*right* limited inc axilla · 0.04mm/px · 7 of 7 slices shown]
[im 1/7]
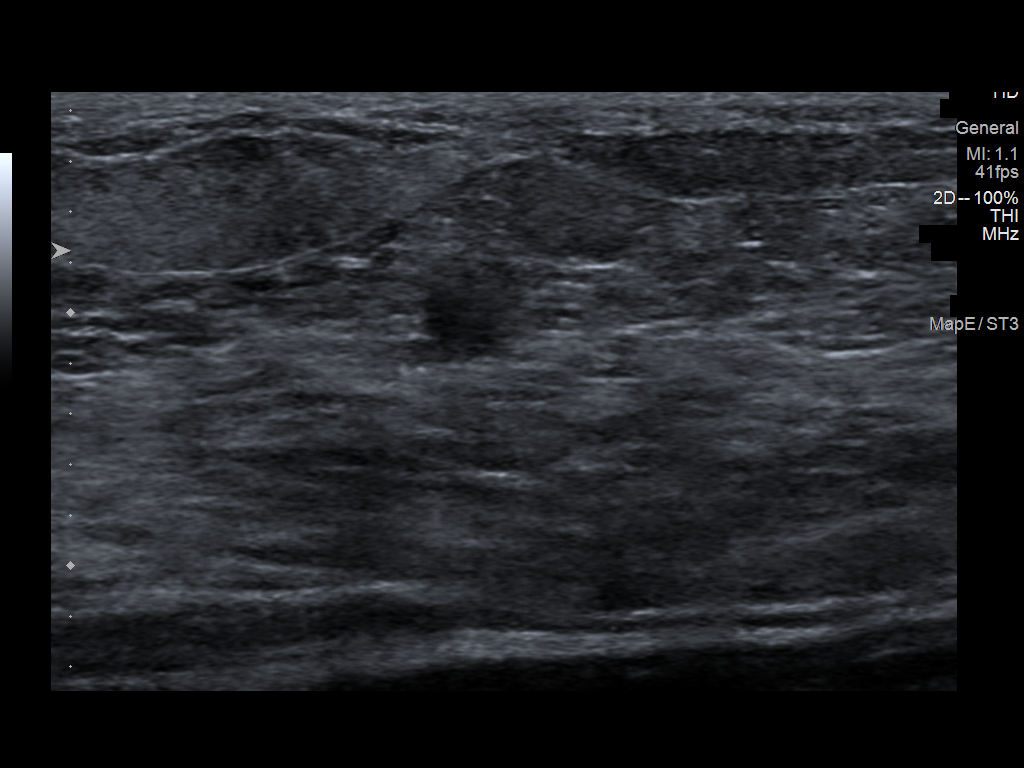
[im 2/7]
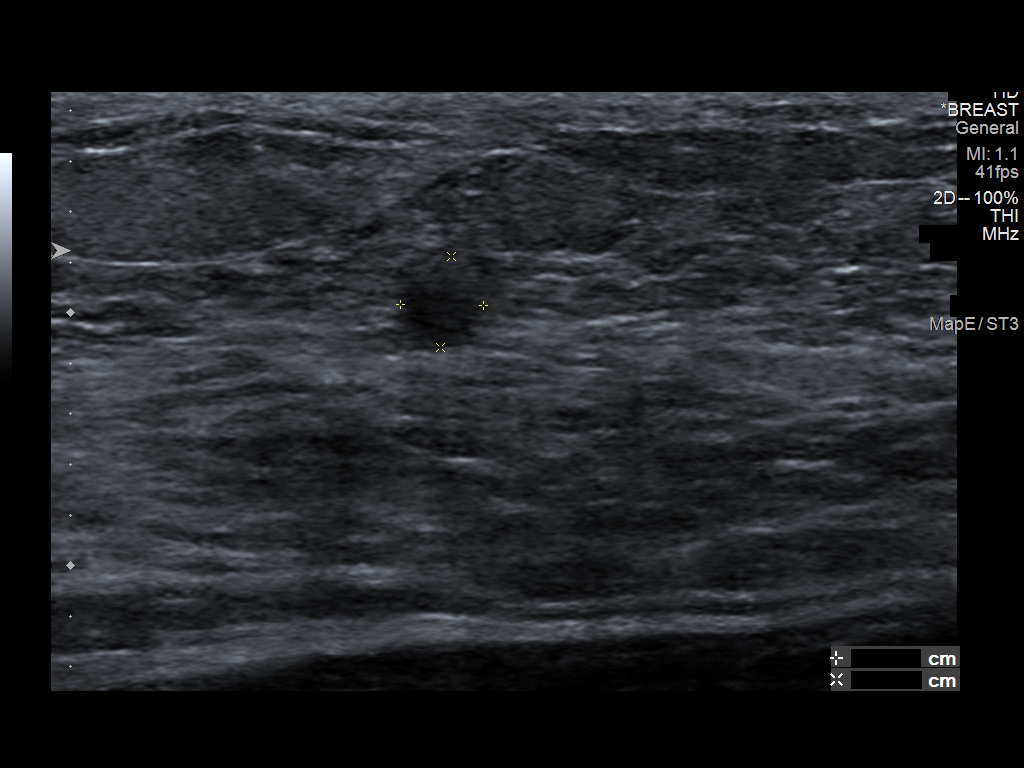
[im 3/7]
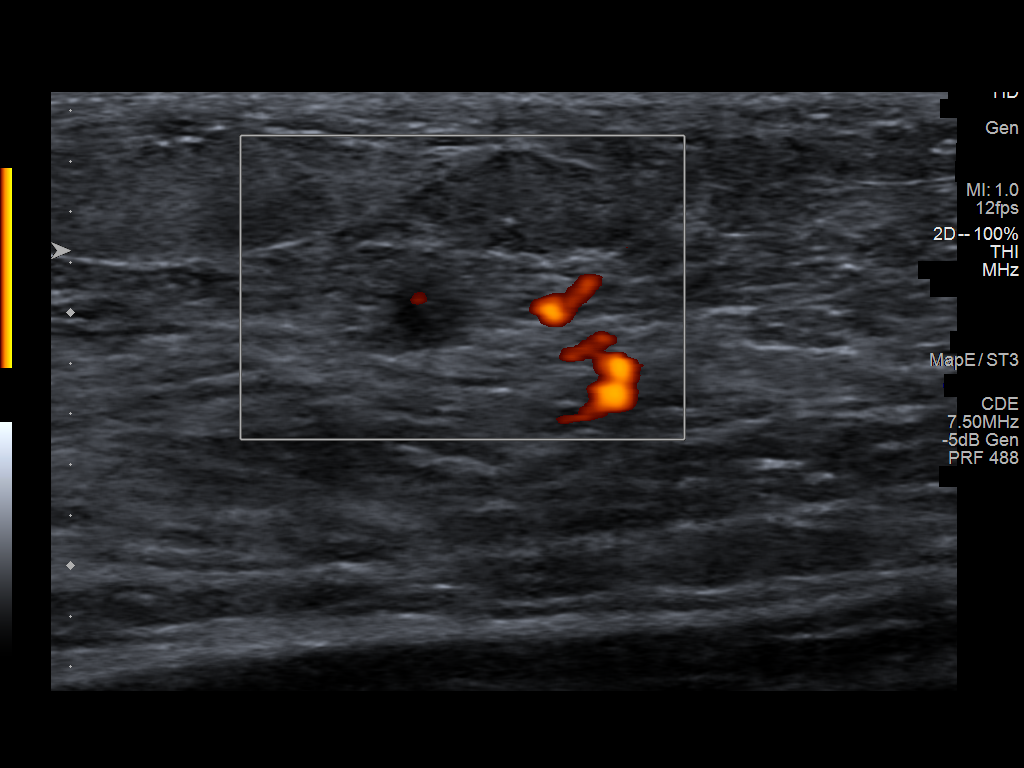
[im 4/7]
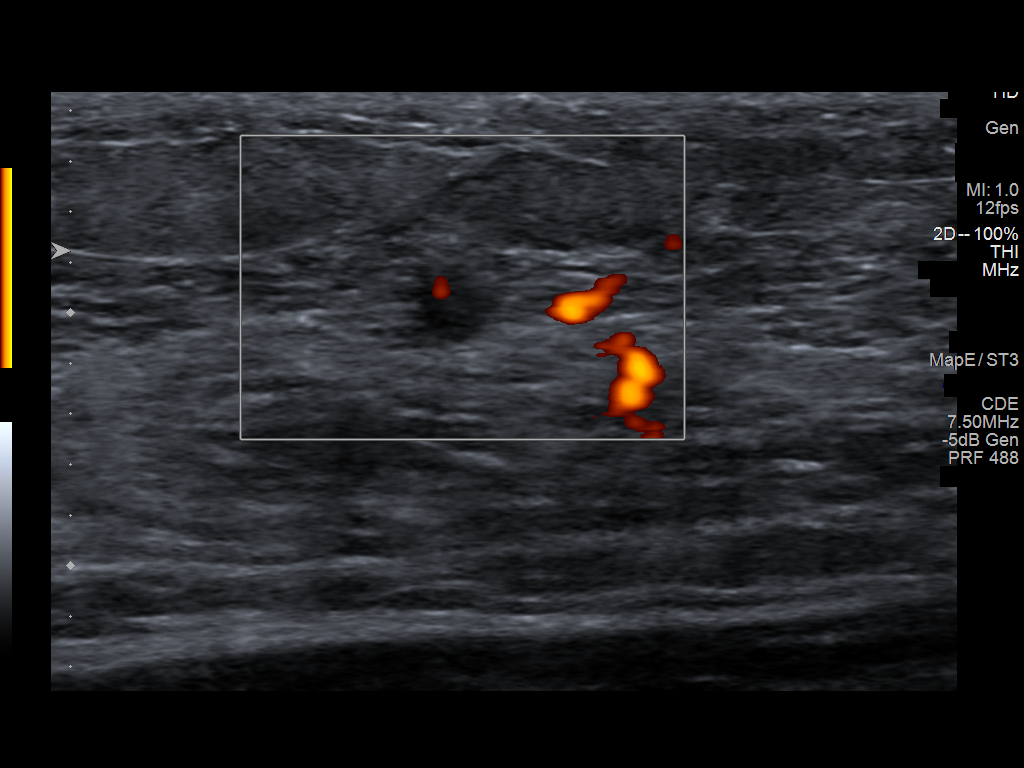
[im 5/7]
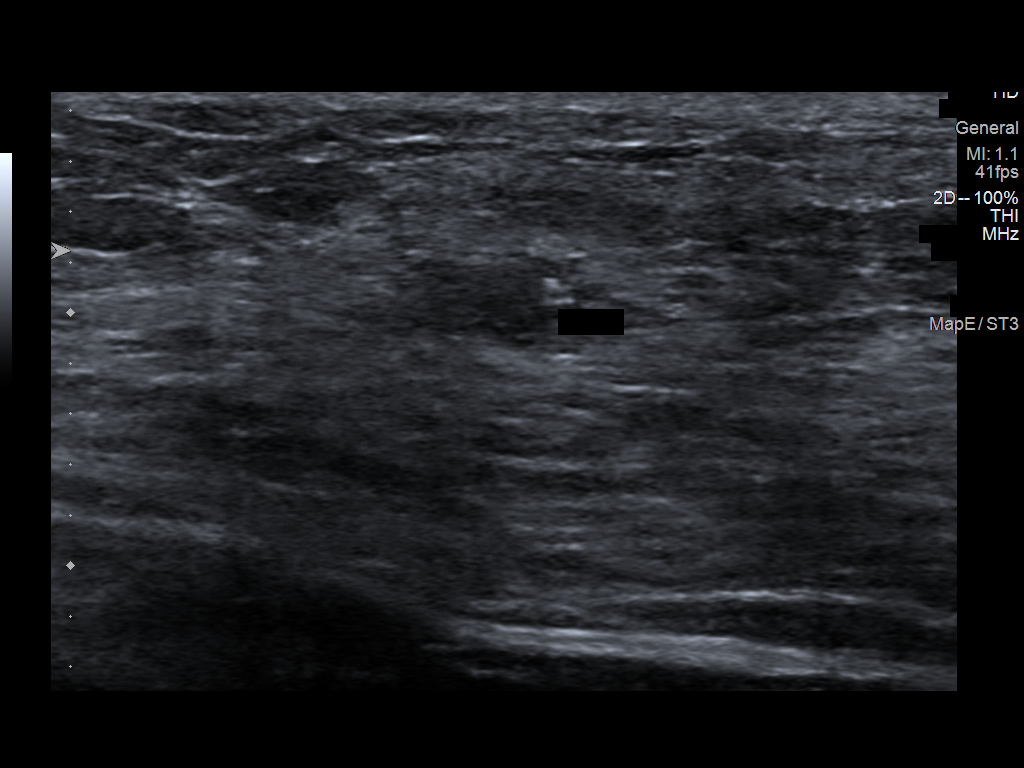
[im 6/7]
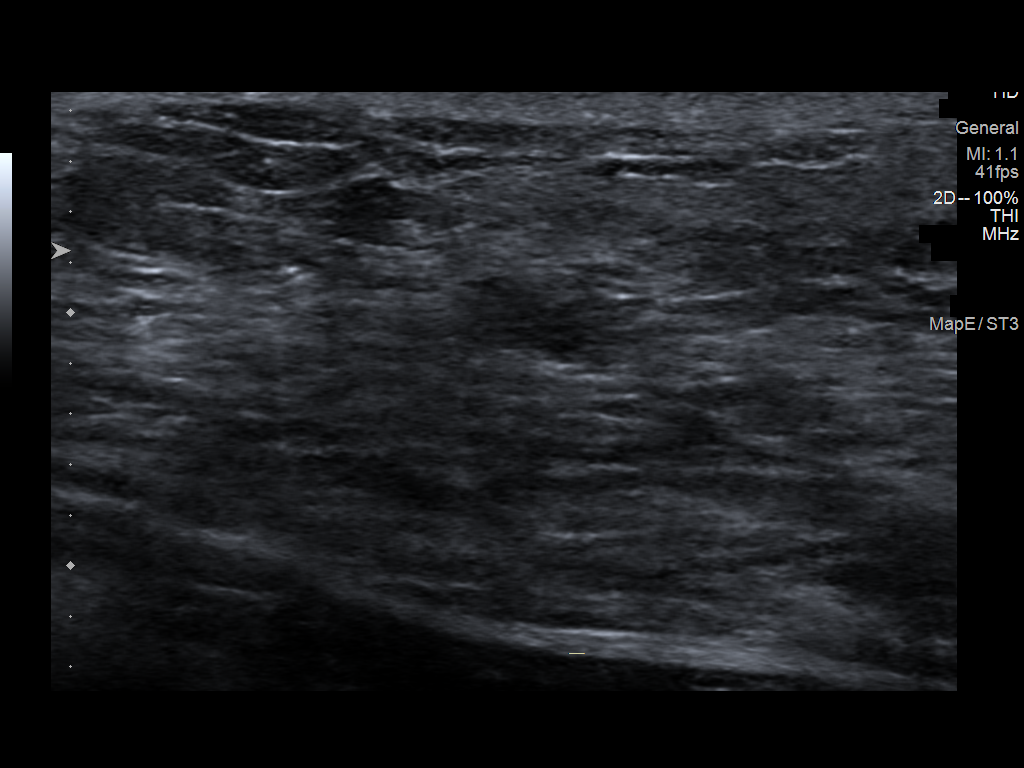
[im 7/7]
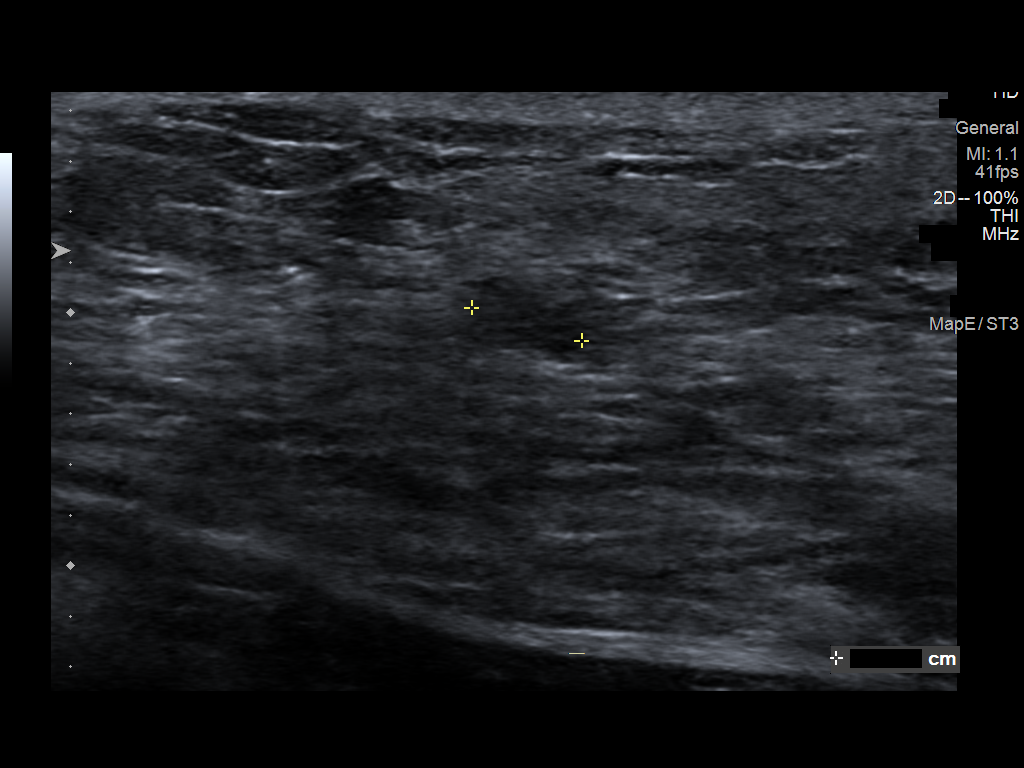

[7 of 7 positions shown; findings below may reference images not displayed]

FINDINGS: Targeted ultrasound is performed in the right breast at 1 o'clock 6
cm from the nipple demonstrating an irregular hypoechoic mass
measuring 0.3 x 0.4 x 0.5 cm, previously measuring 0.3 x 0.3 x
cm. The associated biopsy marking clip is visualized. The margin of
the mass today appear slightly more irregular which may be secondary
to post biopsy change.
IMPRESSION: Probably benign mass in the right breast at 1 o'clock, previously
biopsied demonstrating fibroadenoma.

RECOMMENDATION:
Right breast ultrasound in 6 months.

I have discussed the findings and recommendations with the patient.
If applicable, a reminder letter will be sent to the patient
regarding the next appointment.

BI-RADS CATEGORY  3: Probably benign.

## 2023-07-22 LAB — HM PAP SMEAR: HM Pap smear: NEGATIVE

## 2023-08-18 ENCOUNTER — Ambulatory Visit: Payer: 59 | Admitting: Neurology

## 2023-11-09 IMAGING — US US ABDOMEN LIMITED
1 series · 14 of 25 positions shown · non-contrast
Comparison: None Available.

CLINICAL DATA: Epigastric pain for 5 months

EXAM:
ULTRASOUND ABDOMEN LIMITED RIGHT UPPER QUADRANT

[Series 1: us abdomen limited · 0.14mm/px · 14 of 44 slices shown]
[im 1/44]
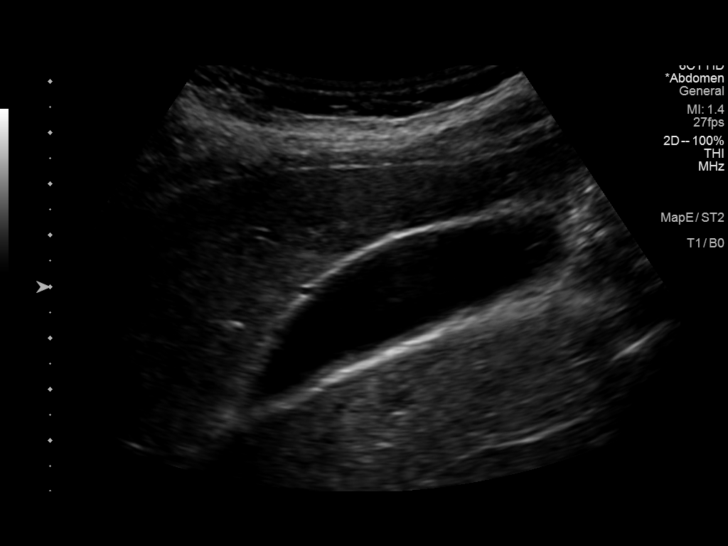
[im 4/44]
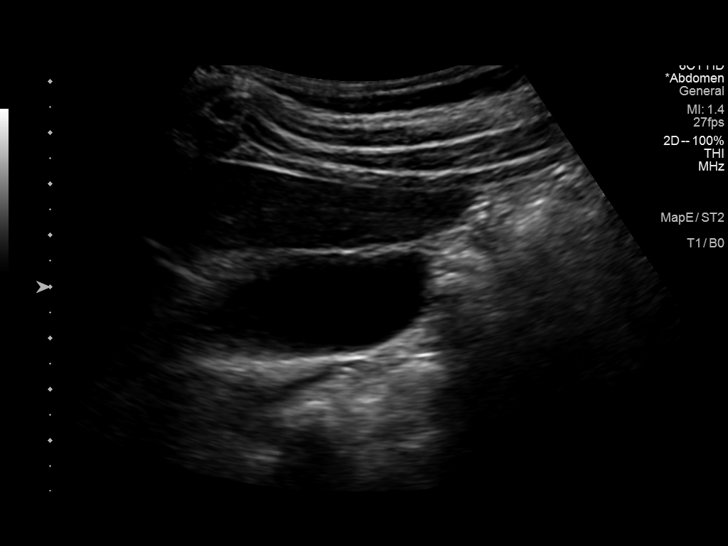
[im 8/44]
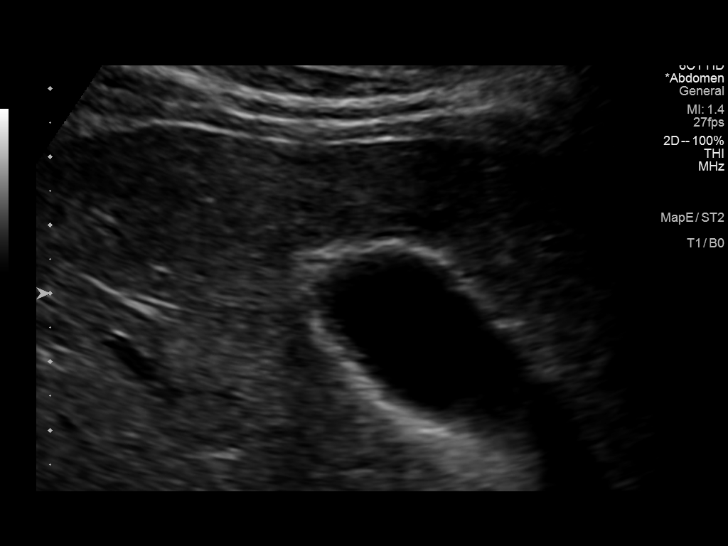
[im 11/44]
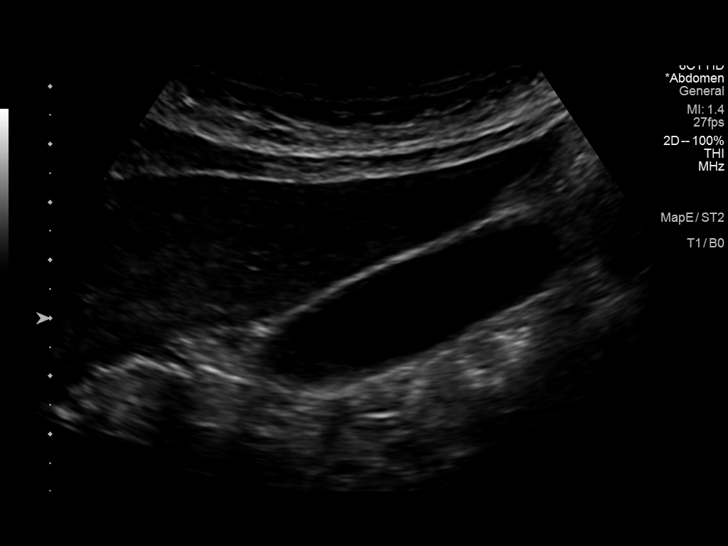
[im 15/44]
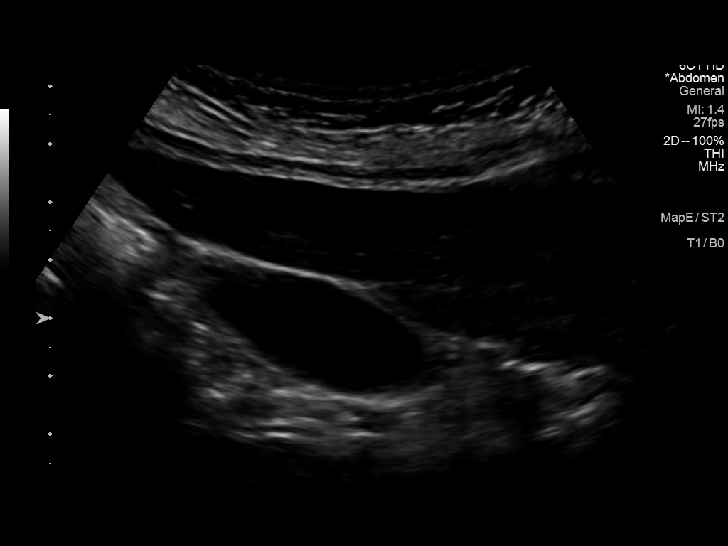
[im 17/44]
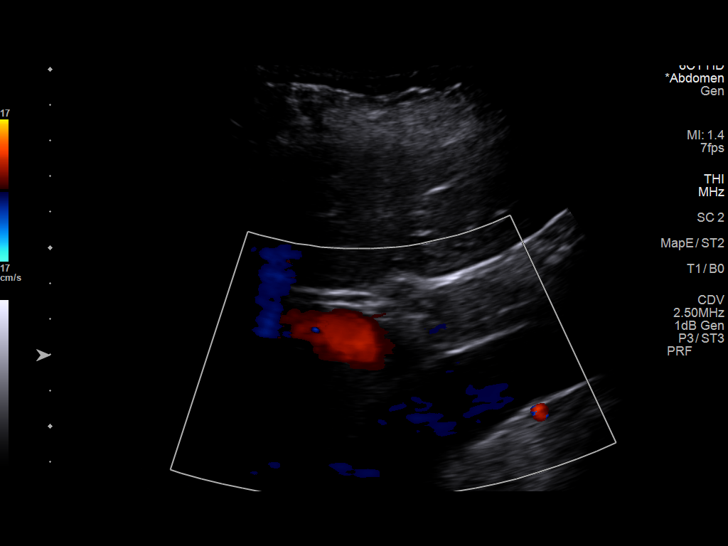
[im 20/44]
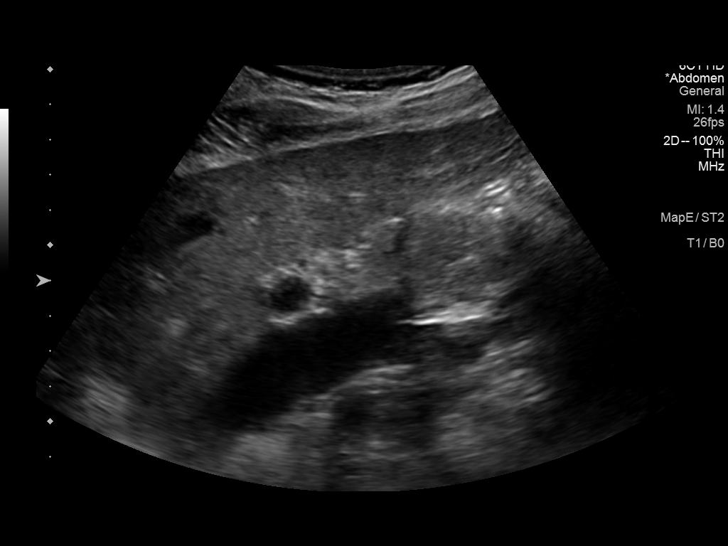
[im 24/44]
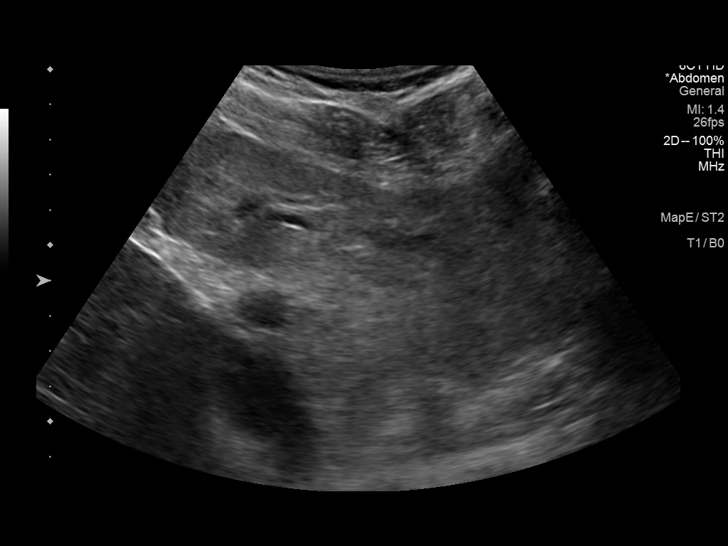
[im 27/44]
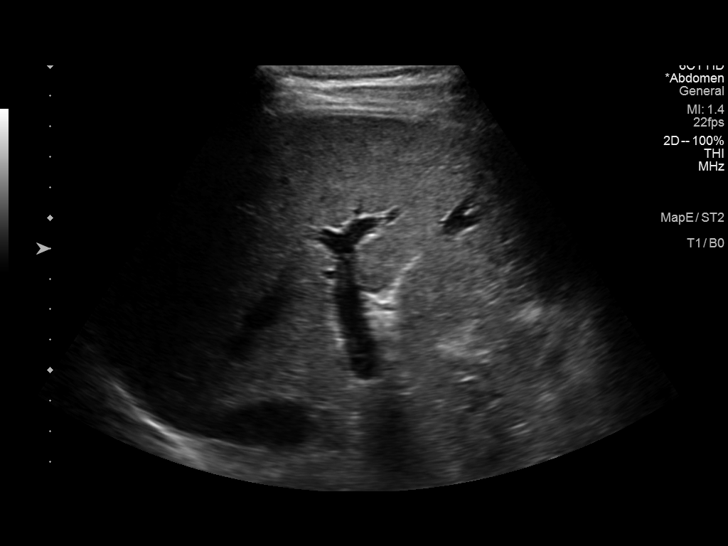
[im 29/44]
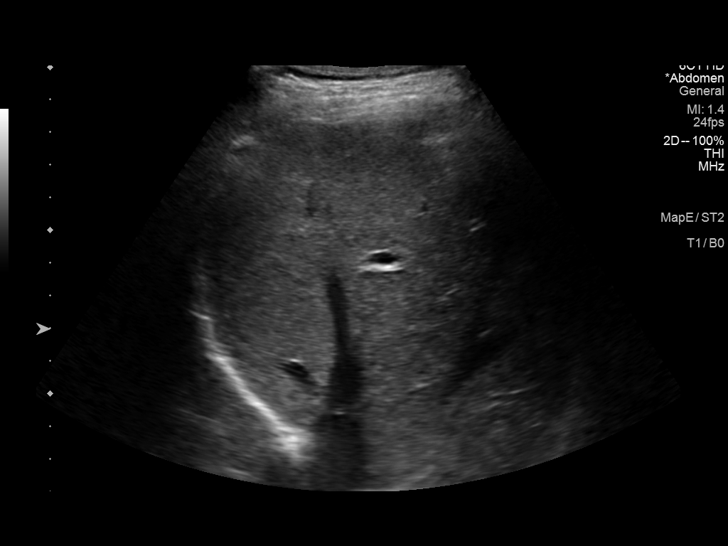
[im 33/44]
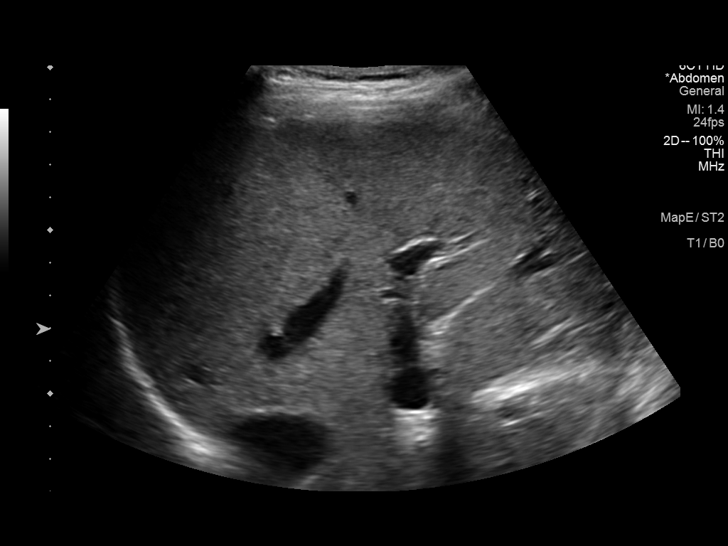
[im 36/44]
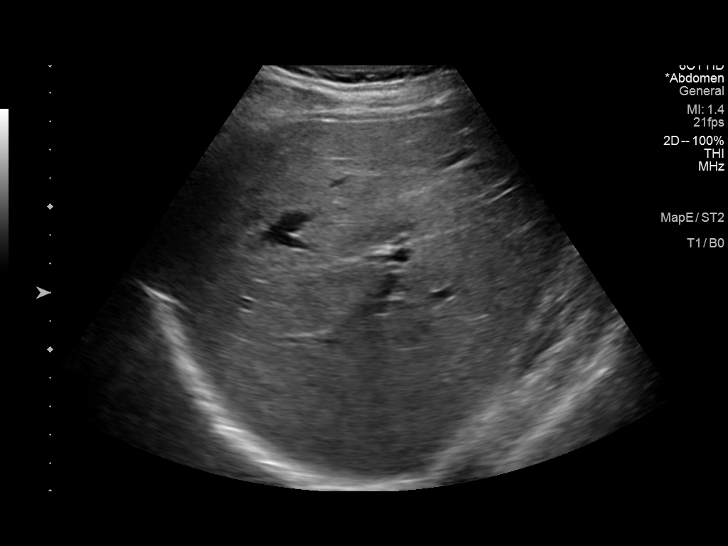
[im 40/44]
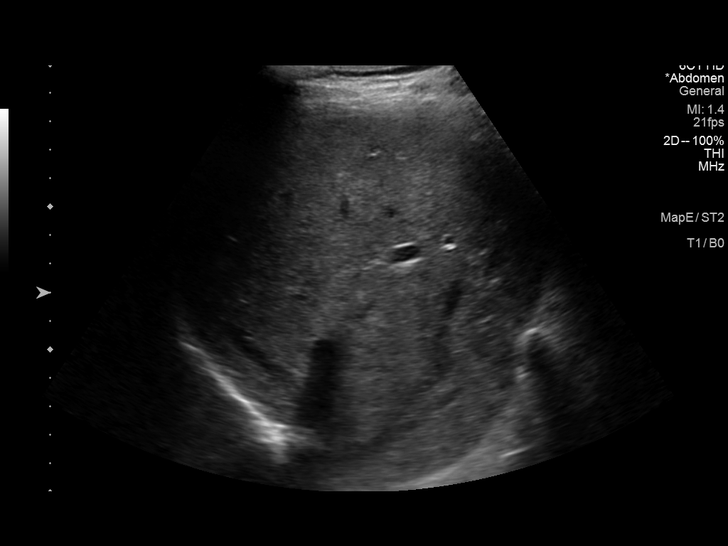
[im 44/44]
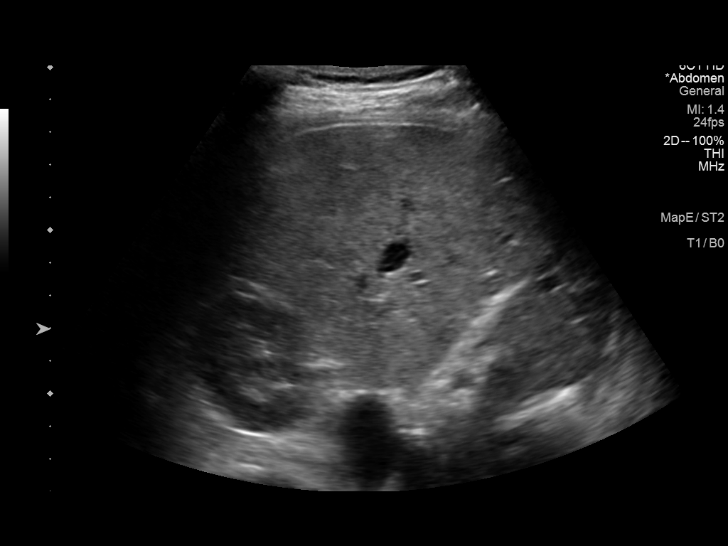

[14 of 25 positions shown; findings below may reference images not displayed]

FINDINGS: Gallbladder:

No gallstones or wall thickening visualized. No sonographic Murphy
sign noted by sonographer.

Common bile duct:

Diameter: 3.2 mm

Liver:

No focal lesion identified. Within normal limits in parenchymal
echogenicity. Portal vein is patent on color Doppler imaging with
normal direction of blood flow towards the liver.

Other: None.
IMPRESSION: Normal study.  No cause for epigastric pain identified.
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
## 2023-11-11 ENCOUNTER — Encounter: Payer: Self-pay | Admitting: Neurology

## 2023-11-11 ENCOUNTER — Ambulatory Visit: Payer: 59 | Admitting: Neurology

## 2023-11-11 VITALS — BP 100/59 | HR 71 | Ht 66.0 in | Wt 157.0 lb

## 2023-11-11 DIAGNOSIS — G43119 Migraine with aura, intractable, without status migrainosus: Secondary | ICD-10-CM

## 2023-11-11 MED ORDER — AIMOVIG 140 MG/ML ~~LOC~~ SOAJ: 140.0000 mg | SUBCUTANEOUS | 5 refills | Status: DC

## 2023-11-11 NOTE — Patient Instructions (Signed)
 Plan to start Aimovig injection every 28 days.  Check pregnancy test first.  IF NO IMPROVEMENT IN 3 MONTHS ON THE AIMOVIG, CONTACT ME AND WE CAN CHANGE MEDICATION Take Ubrelvy at earliest onset of migraine.  May repeat after 2 hours if needed.  Maximum 2 in 24 hours.  Let me know if it works or not. Limit use of pain relievers to no more than 2 days out of week to prevent risk of rebound or medication-overuse headache. Check MRI of brain with and without contrast Follow up 6 months.

## 2023-11-29 IMAGING — US US BREAST*R* LIMITED INC AXILLA
1 series · 4 of 4 positions shown · non-contrast
Comparison: None Available.

CLINICAL DATA: 26-year-old female presenting for follow-up of a
right breast mass which was previously biopsied, demonstrating
fibroadenoma.

EXAM:
ULTRASOUND OF THE RIGHT BREAST

[Series 1: us breast*right* limited inc axilla · 0.04mm/px · 4 of 4 slices shown]
[im 1/4]
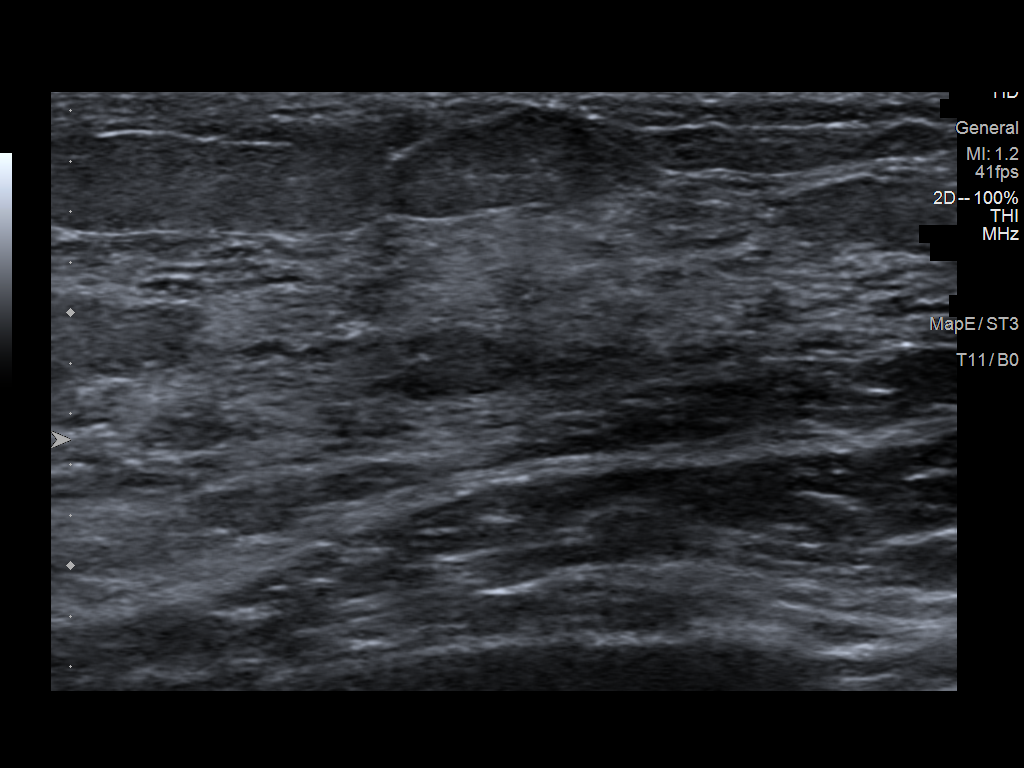
[im 2/4]
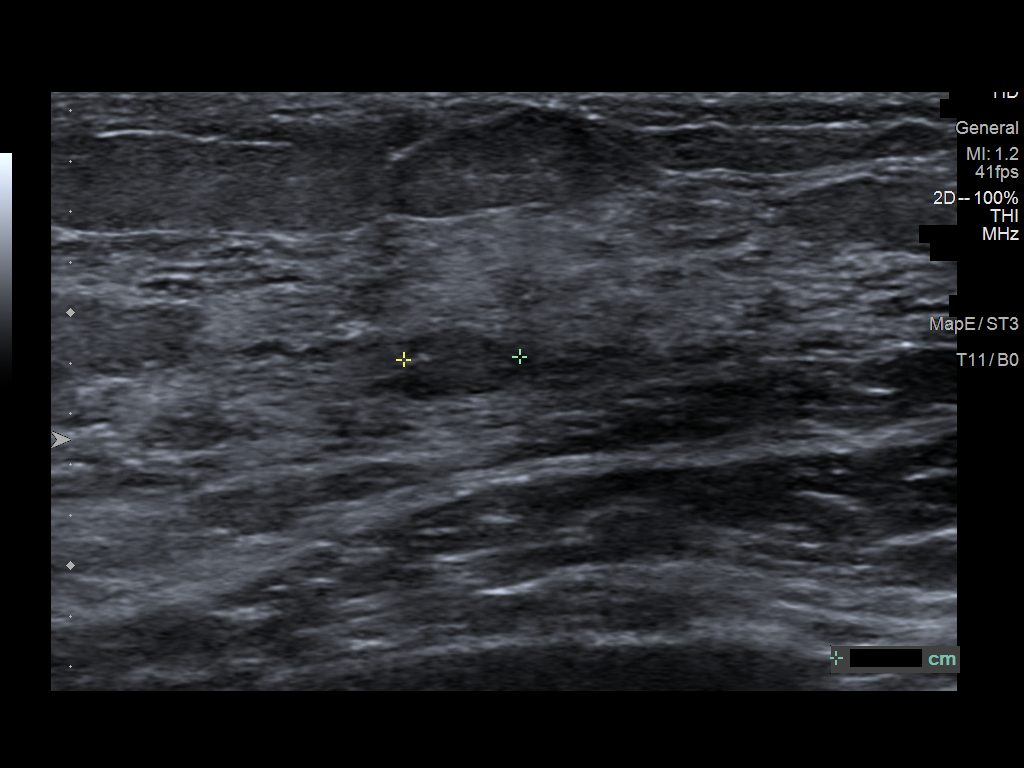
[im 3/4]
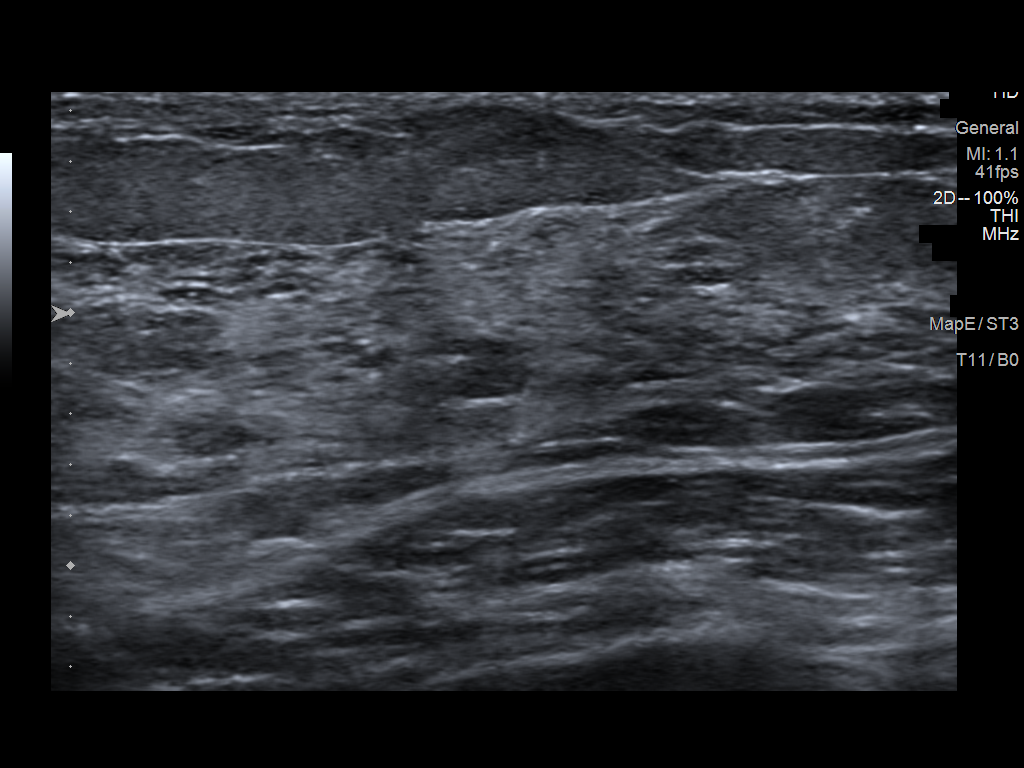
[im 4/4]
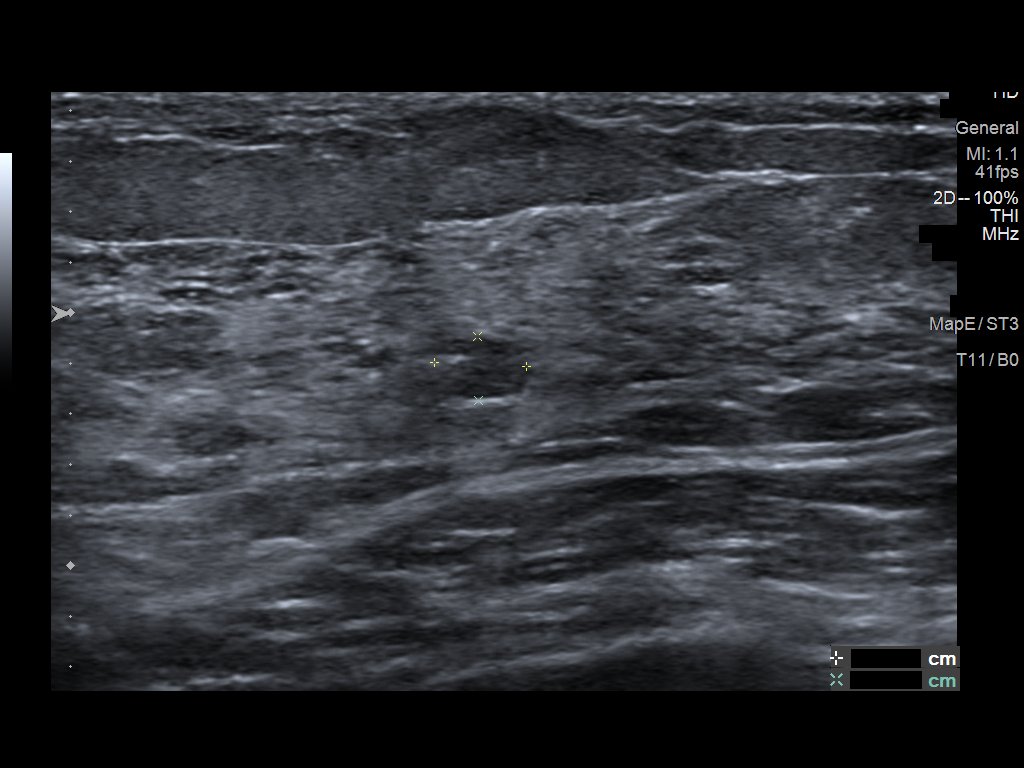

[4 of 4 positions shown; findings below may reference images not displayed]

FINDINGS: Targeted ultrasound is performed, showing a circumscribed,
hypoechoic mass at the 1 o'clock position 6 cm from the nipple.
Today it measures 5 x 4 x 3 mm, unchanged from prior. The margins
have a more circumscribed appearance today. Note is made of an
associated post biopsy clip.
IMPRESSION: Benign right breast mass, biopsy proven to be a fibroadenoma. No
further follow-up required.

RECOMMENDATION:
Screening mammogram at age 41 unless there are persistent or
intervening clinical concerns. (Code:YK-5-PPH)

I have discussed the findings and recommendations with the patient.
If applicable, a reminder letter will be sent to the patient
regarding the next appointment.

BI-RADS CATEGORY  2: Benign.

## 2023-12-21 ENCOUNTER — Other Ambulatory Visit

## 2024-03-25 ENCOUNTER — Ambulatory Visit: Payer: Self-pay | Admitting: Family Medicine

## 2024-03-25 ENCOUNTER — Ambulatory Visit: Payer: 59 | Admitting: Family Medicine

## 2024-03-25 VITALS — BP 104/70 | HR 91 | Temp 98.4°F | Ht 67.72 in | Wt 158.6 lb

## 2024-03-25 DIAGNOSIS — E559 Vitamin D deficiency, unspecified: Secondary | ICD-10-CM

## 2024-03-25 DIAGNOSIS — R17 Unspecified jaundice: Secondary | ICD-10-CM

## 2024-03-25 DIAGNOSIS — Z0000 Encounter for general adult medical examination without abnormal findings: Secondary | ICD-10-CM | POA: Diagnosis not present

## 2024-03-25 DIAGNOSIS — R5383 Other fatigue: Secondary | ICD-10-CM | POA: Diagnosis not present

## 2024-03-25 LAB — CBC WITH DIFFERENTIAL/PLATELET
Basophils Absolute: 0.1 K/uL (ref 0.0–0.1)
Basophils Relative: 0.8 % (ref 0.0–3.0)
Eosinophils Absolute: 0.3 K/uL (ref 0.0–0.7)
Eosinophils Relative: 4.7 % (ref 0.0–5.0)
HCT: 40.4 % (ref 36.0–46.0)
Hemoglobin: 14.1 g/dL (ref 12.0–15.0)
Lymphocytes Relative: 26.9 % (ref 12.0–46.0)
Lymphs Abs: 2 K/uL (ref 0.7–4.0)
MCHC: 34.8 g/dL (ref 30.0–36.0)
MCV: 86.9 fl (ref 78.0–100.0)
Monocytes Absolute: 0.6 K/uL (ref 0.1–1.0)
Monocytes Relative: 7.5 % (ref 3.0–12.0)
Neutro Abs: 4.5 K/uL (ref 1.4–7.7)
Neutrophils Relative %: 60.1 % (ref 43.0–77.0)
Platelets: 233 K/uL (ref 150.0–400.0)
RBC: 4.65 Mil/uL (ref 3.87–5.11)
RDW: 12.7 % (ref 11.5–15.5)
WBC: 7.4 K/uL (ref 4.0–10.5)

## 2024-03-25 LAB — COMPREHENSIVE METABOLIC PANEL WITH GFR
ALT: 7 U/L (ref 0–35)
AST: 11 U/L (ref 0–37)
Albumin: 4.3 g/dL (ref 3.5–5.2)
Alkaline Phosphatase: 32 U/L — ABNORMAL LOW (ref 39–117)
BUN: 13 mg/dL (ref 6–23)
CO2: 28 meq/L (ref 19–32)
Calcium: 9.3 mg/dL (ref 8.4–10.5)
Chloride: 105 meq/L (ref 96–112)
Creatinine, Ser: 0.96 mg/dL (ref 0.40–1.20)
GFR: 81.13 mL/min (ref >60.00)
Glucose, Bld: 94 mg/dL (ref 70–99)
Potassium: 3.9 meq/L (ref 3.5–5.1)
Sodium: 140 meq/L (ref 135–145)
Total Bilirubin: 1.8 mg/dL — ABNORMAL HIGH (ref 0.2–1.2)
Total Protein: 6.6 g/dL (ref 6.0–8.3)

## 2024-03-25 LAB — LIPID PANEL
Cholesterol: 146 mg/dL (ref 0–200)
HDL: 49.5 mg/dL (ref >39.00)
LDL Cholesterol: 82 mg/dL (ref 0–99)
NonHDL: 96.37
Total CHOL/HDL Ratio: 3
Triglycerides: 72 mg/dL (ref 0.0–149.0)
VLDL: 14.4 mg/dL (ref 0.0–40.0)

## 2024-03-25 LAB — VITAMIN D 25 HYDROXY (VIT D DEFICIENCY, FRACTURES): VITD: 22.96 ng/mL — ABNORMAL LOW (ref 30.00–100.00)

## 2024-03-25 LAB — TSH: TSH: 4.28 u[IU]/mL (ref 0.35–5.50)

## 2024-03-25 LAB — VITAMIN B12: Vitamin B-12: 280 pg/mL (ref 211–911)

## 2024-03-25 NOTE — Progress Notes (Signed)
 Complete physical exam  Patient: Diana Yang   DOB: 06/19/1996   28 y.o. Female  MRN: [id number]  Subjective:    Chief Complaint  Patient presents with   Annual Exam    Diana Yang is a 28 y.o. female who presents today for a complete physical exam. She reports consuming a whole foods diet. Exercise 5 days a week, mixture of at home or at the gym. Cardio and weights. She generally feels fairly well. She reports sleeping poorly. She does not have additional problems to discuss today.    Most recent fall risk assessment:    04/07/2023   11:32 AM  Fall Risk   Falls in the past year? 0  Number falls in past yr: 0  Injury with Fall? 0  Risk for fall due to : No Fall Risks  Follow up Falls evaluation completed     Most recent depression screenings:    04/07/2023   11:34 AM 03/25/2023    8:44 AM  PHQ 2/9 Scores  PHQ - 2 Score 1 1  PHQ- 9 Score 5 7    Vision:Not within last year  and Dental: No current dental problems and Receives regular dental care  Past Medical History:  Diagnosis Date   Acne    Asthma    Autoimmune disease (HCC)    diagnostics not finished prior to pt moving to [place]   Chronic headaches    Complication of anesthesia    PTS MOTHER AND GRANDMOTHER SLOW TO WAKE UP   Family history of adverse reaction to anesthesia    Gilbert's syndrome    Per patient   Kidney stones    Liver disease    Medullary sponge kidney    PCOS (polycystic ovarian syndrome)    Past Surgical History:  Procedure Laterality Date   BREAST BIOPSY Right    2019, 2021   BREAST BIOPSY     08/2020-Right   TONSILLECTOMY AND ADENOIDECTOMY  2015   Social History   Tobacco Use   Smoking status: Never    Passive exposure: Past   Smokeless tobacco: Never  Vaping Use   Vaping status: Never Used  Substance Use Topics   Alcohol use: Yes    Comment: 2x month 1-2 beers   Drug use: Never   Social History   Socioeconomic History   Marital status: Significant Other     Spouse name: Not on file   Number of children: 1   Years of education: Not on file   Highest education level: Bachelor's degree (e.g., BA, AB, BS)  Occupational History   Occupation: med lab  Tobacco Use   Smoking status: Never    Passive exposure: Past   Smokeless tobacco: Never  Vaping Use   Vaping status: Never Used  Substance and Sexual Activity   Alcohol use: Yes    Comment: 2x month 1-2 beers   Drug use: Never   Sexual activity: Yes  Other Topics Concern   Not on file  Social History Narrative   From [place]. Was down here for college, recently returned after being back in [place] for a bit.  Right handed   Social Drivers of Health   Financial Resource Strain: Low Risk  (03/25/2024)   Overall Financial Resource Strain (CARDIA)    Difficulty of Paying Living Expenses: Not very hard  Food Insecurity: No Food Insecurity (03/25/2024)   Hunger Vital Sign    Worried About Running Out of Food in the Last Year:  Never true    Ran Out of Food in the Last Year: Never true  Transportation Needs: No Transportation Needs (03/25/2024)   PRAPARE - Administrator, Civil Service (Medical): No    Lack of Transportation (Non-Medical): No  Physical Activity: Sufficiently Active (03/25/2024)   Exercise Vital Sign    Days of Exercise per Week: 5 days    Minutes of Exercise per Session: 50 min  Stress: Stress Concern Present (03/25/2024)   [employer] of Occupational Health - Occupational Stress Questionnaire    Feeling of Stress: To some extent  Social Connections: Socially Isolated (03/25/2024)   Social Connection and Isolation Panel    Frequency of Communication with Friends and Family: Once a week    Frequency of Social Gatherings with Friends and Family: Once a week    Attends Religious Services: Never    Database administrator or Organizations: No    Attends Engineer, structural: Not on file    Marital Status: Living with partner  Intimate Partner Violence:  Unknown (04/11/2023)   Received from [facility]   HITS    Physically Hurt: Not on file    Insult or Talk Down To: Not on file    Threaten Physical Harm: Not on file    Scream or Curse: Not on file   Family Status  Relation Name Status   Mother  Alive   Father  Deceased   Brother Dallas Searles   MGM  Deceased   MGF  Deceased   PGM  Alive   PGF  Deceased   Daughter  Alive  No partnership data on file   Family History  Problem Relation Age of Onset   Sudden Cardiac Death Mother    Kidney disease Mother    Asthma Mother    Cancer Mother        appendix   Alcohol abuse Father    Drug abuse Father    Early death Father        75   Stroke Father    Graves' disease Father    Thyroid disease Father        Thyroid storm   Arthritis Brother        OA - from marines   Depression Brother    Post-traumatic stress disorder Brother        Marines   Breast cancer Maternal Grandmother    Lung cancer Maternal Grandmother        smoker - but not from smoking   Multiple myeloma Maternal Grandmother        rare blood cancer   High blood pressure Maternal Grandmother    Hearing loss Maternal Grandmother    Osteoporosis Maternal Grandmother    Liver cancer Maternal Grandfather    Diabetes Maternal Grandfather    High Cholesterol Maternal Grandfather    Alzheimer's disease Maternal Grandfather    Breast cancer Paternal Grandmother    Heart attack Paternal Grandmother    Heart disease Paternal Grandmother    Dementia Paternal Grandfather    Alcohol abuse Paternal Grandfather    Jaundice Daughter    Allergies  Allergen Reactions   Penicillins Hives, Shortness Of Breath, Itching, Nausea And Vomiting and Rash    As a child   Penicillin G Other (See Comments)   Patient Care Team: Billy Philippe SAUNDERS, NP as PCP - General (Family Medicine) Jeannetta Lonni ORN, MD as Consulting Physician (Rheumatology) Skeet Juliene SAUNDERS, DO as Consulting Physician (Neurology) Ob/Gyn,  [place]   Outpatient Medications Prior to Visit  Medication Sig   Drospirenone-Estetrol (NEXTSTELLIS PO) Take 1 tablet by mouth daily.   ondansetron  (ZOFRAN -ODT) 4 MG disintegrating tablet Take 1 tablet (4 mg total) by mouth every 8 (eight) hours as needed for nausea or vomiting.   propranolol  (INDERAL ) 60 MG tablet Take 1 tablet (60 mg total) by mouth daily at 2 PM.   [DISCONTINUED] Erenumab -aooe (AIMOVIG ) 140 MG/ML SOAJ Inject 140 mg into the skin every 28 (twenty-eight) days.   No facility-administered medications prior to visit.    Review of Systems  Constitutional:  Positive for malaise/fatigue.  HENT: Negative.    Eyes: Negative.   Respiratory: Negative.    Cardiovascular: Negative.   Gastrointestinal: Negative.   Genitourinary: Negative.   Musculoskeletal: Negative.   Skin: Negative.   Neurological:  Positive for headaches.  Endo/Heme/Allergies: Negative.   Psychiatric/Behavioral:  The patient has insomnia.    See HPI above    Objective:   BP 104/70 (BP Location: Left Arm, Patient Position: Sitting, Cuff Size: Normal)   Pulse 91   Temp 98.4 F (36.9 C) (Oral)   Ht 5' 7.72 (1.72 m)   Wt 158 lb 9.6 oz (71.9 kg)   SpO2 99%   BMI 24.32 kg/m    Physical Exam Vitals reviewed.  Constitutional:      General: She is not in acute distress.    Appearance: Normal appearance. She is normal weight. She is not ill-appearing, toxic-appearing or diaphoretic.  HENT:     Head: Normocephalic and atraumatic.     Right Ear: Tympanic membrane, ear canal and external ear normal. There is no impacted cerumen.     Left Ear: Tympanic membrane, ear canal and external ear normal. There is no impacted cerumen.     Nose:     Right Sinus: No maxillary sinus tenderness or frontal sinus tenderness.     Left Sinus: No maxillary sinus tenderness or frontal sinus tenderness.     Mouth/Throat:     Mouth: Mucous membranes are moist.     Pharynx: Oropharynx is clear. Uvula midline. No pharyngeal  swelling, oropharyngeal exudate, posterior oropharyngeal erythema, uvula swelling or postnasal drip.  Eyes:     General:        Right eye: No discharge.        Left eye: No discharge.     Conjunctiva/sclera: Conjunctivae normal.     Pupils: Pupils are equal, round, and reactive to light.  Neck:     Thyroid: No thyromegaly.  Cardiovascular:     Rate and Rhythm: Normal rate and regular rhythm.     Heart sounds: Normal heart sounds. No murmur heard.    No friction rub. No gallop.  Pulmonary:     Effort: Pulmonary effort is normal. No respiratory distress.     Breath sounds: Normal breath sounds.  Abdominal:     General: Abdomen is flat. Bowel sounds are normal. There is no distension.     Palpations: Abdomen is soft. There is no mass.     Tenderness: There is no abdominal tenderness.  Musculoskeletal:        General: Normal range of motion.     Cervical back: Normal range of motion.     Right lower leg: No edema.     Left lower leg: No edema.  Skin:    General: Skin is warm and dry.  Neurological:     General: No focal deficit present.     Mental  Status: She is alert and oriented to person, place, and time. Mental status is at baseline.     Motor: No weakness.     Gait: Gait normal.  Psychiatric:        Mood and Affect: Mood normal.        Behavior: Behavior normal.        Thought Content: Thought content normal.        Judgment: Judgment normal.      No results found for any visits on 03/25/24.     Assessment & Plan:    Routine Health Maintenance and Physical Exam  Immunization History  Administered Date(s) Administered   Hpv-Unspecified 06/30/2009, 08/31/2009, 01/09/2010   Tdap 03/07/2021    Health Maintenance  Topic Date Due   COVID-19 Vaccine (1) Never done   Hepatitis C Screening  Never done   Hepatitis B Vaccines (1 of 3 - 19+ 3-dose series) Never done   Cervical Cancer Screening (Pap smear)  02/08/2024   INFLUENZA VACCINE  03/26/2024   DTaP/Tdap/Td (2 -  Td or Tdap) 03/08/2031   HPV VACCINES  Completed   HIV Screening  Completed   Meningococcal B Vaccine  Aged Out    Discussed health benefits of physical activity, and encouraged her to engage in regular exercise appropriate for her age and condition.  Annual physical exam -     CBC with Differential/Platelet -     Comprehensive metabolic panel with GFR -     Lipid panel -     TSH  Fatigue, unspecified type -     CBC with Differential/Platelet -     Comprehensive metabolic panel with GFR -     Vitamin B12 -     VITAMIN D  25 Hydroxy (Vit-D Deficiency, Fractures)  Vitamin D  deficiency -     VITAMIN D  25 Hydroxy (Vit-D Deficiency, Fractures)    Return in about 1 year (around 03/25/2025) for physical. 1.Review health maintenance:  -Cervical cancer screening: Last year; request records.  -Covid vaccine: Declines  -Hep B booster: 2020 [facility], requesting patient upload immunization records -Hep C screening: Request records from GYN   2.Physical exam completed today. No concerns based on physical exam.   3.Ordered labs (CBC, CMP, Lipid panel-fasting, TSH, Vitamin B12/D) based on physical, complaints of fatigue, and vitamin D  deficiency.  4.Continue a healthy diet and participating in regular exercise.   Desere Gwin, NP

## 2024-03-25 NOTE — Patient Instructions (Addendum)
-  It was great to see you today!  -Physical exam completed today. No concerns based on physical exam.  -Please upload immunization records to MyChart when you get an opportunity.  -Ordered labs. Office will call with lab results and will be available through MyChart.  -Continue a healthy diet and participating in regular exercise.  -Follow up in 1 year for a physical.

## 2024-03-30 NOTE — Addendum Note (Signed)
 Addended by: ELNER NANNY B on: 03/30/2024 04:35 PM   Modules accepted: Orders

## 2024-04-01 ENCOUNTER — Telehealth: Payer: Self-pay | Admitting: *Deleted

## 2024-04-01 ENCOUNTER — Other Ambulatory Visit: Payer: Self-pay

## 2024-04-01 DIAGNOSIS — E559 Vitamin D deficiency, unspecified: Secondary | ICD-10-CM

## 2024-04-01 DIAGNOSIS — E538 Deficiency of other specified B group vitamins: Secondary | ICD-10-CM

## 2024-04-01 NOTE — Telephone Encounter (Signed)
 Copied from CRM #[id number]. Topic: Clinical - Request for Lab/Test Order >> Apr 01, 2024  8:13 AM Laymon HERO wrote: Reason for CRM: Patient calling to schedule a 3 month follow up for labs- needing to get an order for labs. Please call and schedule

## 2024-04-01 NOTE — Telephone Encounter (Signed)
 Called pt and lab visit made.
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
## 2024-05-17 ENCOUNTER — Ambulatory Visit: Admitting: Neurology

## 2024-05-17 ENCOUNTER — Encounter: Payer: Self-pay | Admitting: Neurology

## 2024-05-17 VITALS — BP 100/63 | HR 107 | Resp 20 | Ht 67.0 in | Wt 159.0 lb

## 2024-05-17 DIAGNOSIS — G43119 Migraine with aura, intractable, without status migrainosus: Secondary | ICD-10-CM

## 2024-05-17 MED ORDER — AIMOVIG 140 MG/ML ~~LOC~~ SOAJ: 140.0000 mg | SUBCUTANEOUS | 11 refills | Status: AC

## 2024-05-17 NOTE — Progress Notes (Signed)
 Medication Samples have been provided to the patient.  Drug name: Nurtec       Strength: 75 mg        Qty: 2  LOT: [id number]  Exp.Date: 11/28  Dosing instructions: as needed  The patient has been instructed regarding the correct time, dose, and frequency of taking this medication, including desired effects and most common side effects.   Diana Yang 9:50 AM 05/17/2024

## 2024-05-17 NOTE — Patient Instructions (Signed)
  Start Aimovig  140mg  injection every 28 days.  Contact us  in 3 months with update and we can increase dose if needed. Take Nurtec at earliest onset of headache.  May repeat dose once in 2 hours if needed.  Maximum 1 tablet in 24 hours.  Let me know if it worked. Limit use of pain relievers to no more than 9 days out of the month.  These medications include acetaminophen , NSAIDs (ibuprofen /Advil /Motrin , naproxen/Aleve, triptans (Imitrex /sumatriptan ), Excedrin, and narcotics.  This will help reduce risk of rebound headaches. Be aware of common food triggers:  - Caffeine:  coffee, black tea, cola, Mt. Dew  - Chocolate  - Dairy:  aged cheeses (brie, blue, cheddar, gouda, Parmasan, provolone, romano, Swiss, etc), chocolate milk, buttermilk, sour cream, limit eggs and yogurt  - Nuts, peanut butter  - Alcohol  - Cereals/grains:  FRESH breads (fresh bagels, sourdough, doughnuts), yeast productions  - Processed/canned/aged/cured meats (pre-packaged deli meats, hotdogs)  - MSG/glutamate:  soy sauce, flavor enhancer, pickled/preserved/marinated foods  - Sweeteners:  aspartame (Equal, Nutrasweet).  Sugar and Splenda are okay  - Vegetables:  legumes (lima beans, lentils, snow peas, fava beans, pinto peans, peas, garbanzo beans), sauerkraut, onions, olives, pickles  - Fruit:  avocados, bananas, citrus fruit (orange, lemon, grapefruit), mango  - Other:  Frozen meals, macaroni and cheese Routine exercise Stay adequately hydrated (aim for 64 oz water daily) Keep headache diary Maintain proper stress management Maintain proper sleep hygiene Do not skip meals Consider supplements:  magnesium  citrate 400mg  daily, riboflavin 400mg  daily, coenzyme Q10 100mg  three times daily.

## 2024-05-19 ENCOUNTER — Telehealth: Payer: Self-pay | Admitting: Pharmacy Technician

## 2024-05-19 NOTE — Telephone Encounter (Signed)
 Pharmacy Patient Advocate Encounter   Received notification from Fax that prior authorization for AIMOVIG  140MG  is required/requested.   Insurance verification completed.   The patient is insured through [employer] .   Per test claim: PA required; PA submitted to above mentioned insurance via Latent Key/confirmation #/EOC [id number] Status is pending

## 2024-05-20 ENCOUNTER — Encounter: Payer: Self-pay | Admitting: Neurology

## 2024-05-21 NOTE — Telephone Encounter (Signed)
 Pharmacy Patient Advocate Encounter  Received notification from [employer] that Prior Authorization for Aimovig  140MG /ML auto-injectors has been APPROVED from 05-19-2024 to 05-19-2025   PA #/Case ID/Reference #: [id number]

## 2024-07-19 ENCOUNTER — Other Ambulatory Visit (INDEPENDENT_AMBULATORY_CARE_PROVIDER_SITE_OTHER)

## 2024-07-19 DIAGNOSIS — E559 Vitamin D deficiency, unspecified: Secondary | ICD-10-CM

## 2024-07-19 DIAGNOSIS — E538 Deficiency of other specified B group vitamins: Secondary | ICD-10-CM

## 2024-07-20 ENCOUNTER — Ambulatory Visit: Payer: Self-pay | Admitting: Family Medicine

## 2024-07-20 DIAGNOSIS — E559 Vitamin D deficiency, unspecified: Secondary | ICD-10-CM

## 2024-07-20 LAB — VITAMIN B12: Vitamin B-12: 318 pg/mL (ref 211–911)

## 2024-07-20 LAB — VITAMIN D 25 HYDROXY (VIT D DEFICIENCY, FRACTURES): VITD: 17.69 ng/mL — ABNORMAL LOW (ref 30.00–100.00)

## 2024-07-20 MED ORDER — VITAMIN D3 1.25 MG (50000 UT) PO CAPS: 1.2500 mg | ORAL_CAPSULE | ORAL | 0 refills | Status: AC

## 2024-11-26 ENCOUNTER — Ambulatory Visit: Admitting: Neurology

## 2024-12-10 ENCOUNTER — Ambulatory Visit: Admitting: Neurology

## 2025-03-28 ENCOUNTER — Encounter: Admitting: Family Medicine
